# Patient Record
Sex: Male | Born: 1960
Health system: Southern US, Community
[De-identification: ages and names within clinical notes are randomized; demographics above are authoritative.]

## PROBLEM LIST (undated history)

## (undated) DIAGNOSIS — E119 Type 2 diabetes mellitus without complications: Secondary | ICD-10-CM

## (undated) DIAGNOSIS — F329 Major depressive disorder, single episode, unspecified: Secondary | ICD-10-CM

## (undated) DIAGNOSIS — B20 Human immunodeficiency virus [HIV] disease: Secondary | ICD-10-CM

## (undated) DIAGNOSIS — I639 Cerebral infarction, unspecified: Secondary | ICD-10-CM

## (undated) DIAGNOSIS — E785 Hyperlipidemia, unspecified: Secondary | ICD-10-CM

## (undated) DIAGNOSIS — H479 Unspecified disorder of visual pathways: Secondary | ICD-10-CM

## (undated) DIAGNOSIS — F32A Depression, unspecified: Secondary | ICD-10-CM

## (undated) DIAGNOSIS — I10 Essential (primary) hypertension: Secondary | ICD-10-CM

## (undated) DIAGNOSIS — M109 Gout, unspecified: Secondary | ICD-10-CM

## (undated) DIAGNOSIS — Z5189 Encounter for other specified aftercare: Secondary | ICD-10-CM

## (undated) HISTORY — DX: Hyperlipidemia, unspecified: E78.5

## (undated) HISTORY — DX: Major depressive disorder, single episode, unspecified: F32.9

## (undated) HISTORY — DX: Type 2 diabetes mellitus without complications: E11.9

## (undated) HISTORY — DX: Unspecified disorder of visual pathways: H47.9

## (undated) HISTORY — PX: APPENDECTOMY: SHX54

## (undated) HISTORY — DX: Cerebral infarction, unspecified: I63.9

## (undated) HISTORY — DX: Encounter for other specified aftercare: Z51.89

## (undated) HISTORY — PX: TONSILLECTOMY: SUR1361

## (undated) HISTORY — DX: Depression, unspecified: F32.A

---

## 2000-06-06 DIAGNOSIS — H479 Unspecified disorder of visual pathways: Secondary | ICD-10-CM

## 2000-06-06 DIAGNOSIS — I639 Cerebral infarction, unspecified: Secondary | ICD-10-CM

## 2000-06-06 HISTORY — DX: Unspecified disorder of visual pathways: H47.9

## 2000-06-06 HISTORY — DX: Cerebral infarction, unspecified: I63.9

## 2005-08-31 ENCOUNTER — Ambulatory Visit: Payer: Self-pay | Admitting: Family Medicine

## 2007-10-08 ENCOUNTER — Emergency Department (HOSPITAL_COMMUNITY): Admission: EM | Admit: 2007-10-08 | Discharge: 2007-10-08 | Payer: Self-pay | Admitting: Family Medicine

## 2007-12-17 ENCOUNTER — Emergency Department (HOSPITAL_COMMUNITY): Admission: EM | Admit: 2007-12-17 | Discharge: 2007-12-17 | Payer: Self-pay | Admitting: Family Medicine

## 2008-01-17 ENCOUNTER — Ambulatory Visit: Payer: Self-pay | Admitting: Internal Medicine

## 2008-01-17 LAB — CONVERTED CEMR LAB
ALT: 31 units/L (ref 0–53)
AST: 22 units/L (ref 0–37)
Albumin: 4.7 g/dL (ref 3.5–5.2)
Alkaline Phosphatase: 131 units/L — ABNORMAL HIGH (ref 39–117)
BUN: 17 mg/dL (ref 6–23)
Basophils Absolute: 0 10*3/uL (ref 0.0–0.1)
Basophils Relative: 0 % (ref 0–1)
CD4 T Helper %: 9 % — ABNORMAL LOW (ref 32–62)
Chlamydia, Swab/Urine, PCR: NEGATIVE
Chloride: 101 meq/L (ref 96–112)
Eosinophils Absolute: 0.1 10*3/uL (ref 0.0–0.7)
HCV Ab: NEGATIVE
HIV-1 RNA Quant, Log: 4.55 — ABNORMAL HIGH (ref <1.70)
MCHC: 32.6 g/dL (ref 30.0–36.0)
MCV: 87.8 fL (ref 78.0–100.0)
Monocytes Relative: 8 % (ref 3–12)
Neutro Abs: 2.3 10*3/uL (ref 1.7–7.7)
Neutrophils Relative %: 60 % (ref 43–77)
Platelets: 137 10*3/uL — ABNORMAL LOW (ref 150–400)
Potassium: 3.9 meq/L (ref 3.5–5.3)
RDW: 16.3 % — ABNORMAL HIGH (ref 11.5–15.5)
Sodium: 138 meq/L (ref 135–145)
Total Lymphocytes %: 31 % (ref 12–46)
WBC, lymph enumeration: 3.9 10*3/uL — ABNORMAL LOW (ref 4.0–10.5)

## 2008-02-18 ENCOUNTER — Ambulatory Visit: Payer: Self-pay | Admitting: Internal Medicine

## 2008-02-18 LAB — CONVERTED CEMR LAB
Absolute CD4: 191 #/uL — ABNORMAL LOW (ref 381–1469)
BUN: 15 mg/dL (ref 6–23)
Chloride: 108 meq/L (ref 96–112)
Creatinine, Ser: 1.57 mg/dL — ABNORMAL HIGH (ref 0.40–1.50)
Glucose, Bld: 93 mg/dL (ref 70–99)
HIV 1 RNA Quant: 2140 copies/mL — ABNORMAL HIGH (ref <50)
Potassium: 4.4 meq/L (ref 3.5–5.3)
Total Lymphocytes %: 29 % (ref 12–46)

## 2008-03-20 ENCOUNTER — Encounter: Payer: Self-pay | Admitting: Internal Medicine

## 2008-03-20 ENCOUNTER — Ambulatory Visit: Payer: Self-pay | Admitting: Internal Medicine

## 2008-04-14 DIAGNOSIS — E785 Hyperlipidemia, unspecified: Secondary | ICD-10-CM

## 2008-04-14 DIAGNOSIS — I10 Essential (primary) hypertension: Secondary | ICD-10-CM

## 2008-04-14 DIAGNOSIS — B20 Human immunodeficiency virus [HIV] disease: Secondary | ICD-10-CM

## 2008-04-14 DIAGNOSIS — M109 Gout, unspecified: Secondary | ICD-10-CM

## 2008-07-21 ENCOUNTER — Ambulatory Visit: Payer: Self-pay | Admitting: Internal Medicine

## 2008-07-21 ENCOUNTER — Encounter: Payer: Self-pay | Admitting: Internal Medicine

## 2008-07-21 DIAGNOSIS — K0889 Other specified disorders of teeth and supporting structures: Secondary | ICD-10-CM

## 2008-07-21 LAB — CONVERTED CEMR LAB
Alkaline Phosphatase: 127 units/L — ABNORMAL HIGH (ref 39–117)
BUN: 19 mg/dL (ref 6–23)
CO2: 23 meq/L (ref 19–32)
Eosinophils Absolute: 0 10*3/uL (ref 0.0–0.7)
Eosinophils Relative: 1 % (ref 0–5)
Glucose, Bld: 99 mg/dL (ref 70–99)
HCT: 45.3 % (ref 39.0–52.0)
HIV 1 RNA Quant: 17200 copies/mL — ABNORMAL HIGH (ref <48)
HIV-1 RNA Quant, Log: 4.24 — ABNORMAL HIGH (ref <1.68)
Hemoglobin: 14.9 g/dL (ref 13.0–17.0)
Lymphocytes Relative: 25 % (ref 12–46)
Lymphs Abs: 1.2 10*3/uL (ref 0.7–4.0)
MCV: 86 fL (ref 78.0–100.0)
Monocytes Absolute: 0.5 10*3/uL (ref 0.1–1.0)
Monocytes Relative: 11 % (ref 3–12)
RBC: 5.27 M/uL (ref 4.22–5.81)
Total Bilirubin: 0.3 mg/dL (ref 0.3–1.2)
Total Lymphocytes %: 25 % (ref 12–46)
Total lymphocyte count: 1200 cells/mcL (ref 700–3300)
WBC: 4.8 10*3/uL (ref 4.0–10.5)

## 2008-07-24 ENCOUNTER — Encounter: Payer: Self-pay | Admitting: Internal Medicine

## 2008-08-11 ENCOUNTER — Ambulatory Visit: Payer: Self-pay | Admitting: Internal Medicine

## 2008-08-11 ENCOUNTER — Encounter: Payer: Self-pay | Admitting: Internal Medicine

## 2008-08-12 ENCOUNTER — Telehealth: Payer: Self-pay | Admitting: Internal Medicine

## 2008-09-22 ENCOUNTER — Ambulatory Visit: Payer: Self-pay | Admitting: Internal Medicine

## 2008-09-22 LAB — CONVERTED CEMR LAB
Absolute CD4: 163 #/uL — ABNORMAL LOW (ref 381–1469)
BUN: 19 mg/dL (ref 6–23)
Basophils Relative: 0 % (ref 0–1)
CD4 Count: 163 microliters
CD4 T Helper %: 9 % — ABNORMAL LOW (ref 32–62)
CO2: 25 meq/L (ref 19–32)
Calcium: 9.8 mg/dL (ref 8.4–10.5)
Chloride: 103 meq/L (ref 96–112)
Cholesterol: 204 mg/dL — ABNORMAL HIGH (ref 0–200)
Creatinine, Ser: 1.54 mg/dL — ABNORMAL HIGH (ref 0.40–1.50)
Eosinophils Absolute: 0.1 10*3/uL (ref 0.0–0.7)
Eosinophils Relative: 1 % (ref 0–5)
Glucose, Bld: 96 mg/dL (ref 70–99)
HCT: 43.9 % (ref 39.0–52.0)
HDL: 32 mg/dL — ABNORMAL LOW (ref >39)
HIV-1 RNA Quant, Log: 2.52 — ABNORMAL HIGH (ref <1.68)
Lymphs Abs: 1.8 10*3/uL (ref 0.7–4.0)
MCHC: 31.9 g/dL (ref 30.0–36.0)
MCV: 88.5 fL (ref 78.0–100.0)
Monocytes Relative: 7 % (ref 3–12)
Neutrophils Relative %: 58 % (ref 43–77)
Platelets: 263 10*3/uL (ref 150–400)
RBC: 4.96 M/uL (ref 4.22–5.81)
Total Bilirubin: 1.7 mg/dL — ABNORMAL HIGH (ref 0.3–1.2)
Total CHOL/HDL Ratio: 6.4
Total lymphocyte count: 1815 cells/mcL (ref 700–3300)
Triglycerides: 149 mg/dL (ref <150)
VLDL: 30 mg/dL (ref 0–40)
WBC: 5.5 10*3/uL (ref 4.0–10.5)

## 2008-10-13 ENCOUNTER — Encounter: Payer: Self-pay | Admitting: Internal Medicine

## 2008-10-13 ENCOUNTER — Ambulatory Visit: Payer: Self-pay | Admitting: Internal Medicine

## 2008-10-22 ENCOUNTER — Encounter: Payer: Self-pay | Admitting: Internal Medicine

## 2009-01-15 ENCOUNTER — Ambulatory Visit: Payer: Self-pay | Admitting: Internal Medicine

## 2009-01-15 ENCOUNTER — Encounter: Payer: Self-pay | Admitting: Internal Medicine

## 2009-01-15 DIAGNOSIS — B351 Tinea unguium: Secondary | ICD-10-CM

## 2009-01-15 LAB — CONVERTED CEMR LAB
ALT: 24 units/L (ref 0–53)
AST: 23 units/L (ref 0–37)
Absolute CD4: 150 #/uL — ABNORMAL LOW (ref 381–1469)
CD4 T Helper %: 10 % — ABNORMAL LOW (ref 32–62)
Calcium: 8.9 mg/dL (ref 8.4–10.5)
Chloride: 108 meq/L (ref 96–112)
Creatinine, Ser: 1.57 mg/dL — ABNORMAL HIGH (ref 0.40–1.50)
Eosinophils Absolute: 0.1 10*3/uL (ref 0.0–0.7)
Lymphs Abs: 1.5 10*3/uL (ref 0.7–4.0)
MCV: 88.2 fL (ref 78.0–100.0)
Neutro Abs: 2.7 10*3/uL (ref 1.7–7.7)
Neutrophils Relative %: 57 % (ref 43–77)
Platelets: 139 10*3/uL — ABNORMAL LOW (ref 150–400)
RBC: 4.98 M/uL (ref 4.22–5.81)
Sodium: 140 meq/L (ref 135–145)
Total Bilirubin: 1.4 mg/dL — ABNORMAL HIGH (ref 0.3–1.2)
Total Protein: 7.3 g/dL (ref 6.0–8.3)
WBC: 4.7 10*3/uL (ref 4.0–10.5)

## 2009-02-12 ENCOUNTER — Encounter: Payer: Self-pay | Admitting: Internal Medicine

## 2009-02-12 ENCOUNTER — Ambulatory Visit: Payer: Self-pay | Admitting: Internal Medicine

## 2009-02-12 LAB — CONVERTED CEMR LAB
HIV 1 RNA Quant: 269 {copies}/mL — ABNORMAL HIGH
HIV-1 RNA Quant, Log: 2.43 — ABNORMAL HIGH

## 2009-02-17 ENCOUNTER — Ambulatory Visit: Payer: Self-pay | Admitting: *Deleted

## 2009-02-25 ENCOUNTER — Encounter: Payer: Self-pay | Admitting: Internal Medicine

## 2009-03-05 ENCOUNTER — Ambulatory Visit: Payer: Self-pay | Admitting: Internal Medicine

## 2009-03-05 ENCOUNTER — Encounter: Payer: Self-pay | Admitting: Internal Medicine

## 2009-05-25 ENCOUNTER — Ambulatory Visit: Payer: Self-pay | Admitting: Internal Medicine

## 2009-05-25 LAB — CONVERTED CEMR LAB
ALT: 50 units/L (ref 0–53)
AST: 25 units/L (ref 0–37)
Albumin: 4.7 g/dL (ref 3.5–5.2)
Calcium: 10.2 mg/dL (ref 8.4–10.5)
Chloride: 101 meq/L (ref 96–112)
Creatinine, Ser: 1.69 mg/dL — ABNORMAL HIGH (ref 0.40–1.50)
Lymphocytes Relative: 32 % (ref 12–46)
Lymphs Abs: 1.8 10*3/uL (ref 0.7–4.0)
Neutro Abs: 3.2 10*3/uL (ref 1.7–7.7)
Neutrophils Relative %: 56 % (ref 43–77)
Platelets: 267 10*3/uL (ref 150–400)
Potassium: 4.1 meq/L (ref 3.5–5.3)
Sodium: 136 meq/L (ref 135–145)
Total lymphocyte count: 1792 cells/mcL (ref 700–3300)
WBC: 5.6 10*3/uL (ref 4.0–10.5)

## 2009-05-26 ENCOUNTER — Emergency Department (HOSPITAL_COMMUNITY): Admission: EM | Admit: 2009-05-26 | Discharge: 2009-05-26 | Payer: Self-pay | Admitting: Family Medicine

## 2009-05-28 ENCOUNTER — Ambulatory Visit: Payer: Self-pay | Admitting: Internal Medicine

## 2009-05-28 ENCOUNTER — Encounter: Payer: Self-pay | Admitting: Internal Medicine

## 2009-05-28 DIAGNOSIS — M25569 Pain in unspecified knee: Secondary | ICD-10-CM

## 2009-12-10 ENCOUNTER — Emergency Department (HOSPITAL_COMMUNITY): Admission: EM | Admit: 2009-12-10 | Discharge: 2009-12-10 | Payer: Self-pay | Admitting: Family Medicine

## 2009-12-14 ENCOUNTER — Encounter (INDEPENDENT_AMBULATORY_CARE_PROVIDER_SITE_OTHER): Payer: Self-pay | Admitting: Licensed Clinical Social Worker

## 2010-01-26 ENCOUNTER — Encounter: Payer: Self-pay | Admitting: Internal Medicine

## 2010-02-17 ENCOUNTER — Ambulatory Visit: Payer: Self-pay | Admitting: Internal Medicine

## 2010-02-17 ENCOUNTER — Telehealth: Payer: Self-pay | Admitting: Internal Medicine

## 2010-02-17 LAB — CONVERTED CEMR LAB
Albumin: 4.7 g/dL (ref 3.5–5.2)
Alkaline Phosphatase: 125 units/L — ABNORMAL HIGH (ref 39–117)
BUN: 17 mg/dL (ref 6–23)
Calcium: 10 mg/dL (ref 8.4–10.5)
Chloride: 100 meq/L (ref 96–112)
Eosinophils Absolute: 0.4 10*3/uL (ref 0.0–0.7)
Glucose, Bld: 96 mg/dL (ref 70–99)
HIV 1 RNA Quant: 366 copies/mL — ABNORMAL HIGH (ref <20)
HIV-1 RNA Quant, Log: 2.56 — ABNORMAL HIGH (ref <1.30)
Lymphs Abs: 2.2 10*3/uL (ref 0.7–4.0)
MCV: 91.8 fL (ref 78.0–100.0)
Monocytes Relative: 11 % (ref 3–12)
Neutrophils Relative %: 45 % (ref 43–77)
Potassium: 4.7 meq/L (ref 3.5–5.3)
RBC: 5.02 M/uL (ref 4.22–5.81)
WBC: 5.9 10*3/uL (ref 4.0–10.5)

## 2010-03-03 ENCOUNTER — Ambulatory Visit: Payer: Self-pay | Admitting: Internal Medicine

## 2010-03-08 ENCOUNTER — Encounter: Payer: Self-pay | Admitting: Internal Medicine

## 2010-07-06 NOTE — Progress Notes (Signed)
Summary: c/o gout, called in Vicodin  Phone Note Call from Patient   Caller: Patient Summary of Call: Pt. came in for labs and was requesting something for gout.  I asked him what he had taken in the past and he showed me RX bottles for prednisone and hydrocodone.  He would like something called into [facility] on [address] Initial call taken by: Wendall Mola CMA Duncan Dull),  February 17, 2010 9:51 AM  Follow-up for Phone Call        vicodin 5/500 q 8 hours as needed #60 Follow-up by: Yisroel Ramming MD,  February 17, 2010 11:23 AM    New/Updated Medications: VICODIN 5-500 MG TABS (HYDROCODONE-ACETAMINOPHEN) take one tablet every 6 hours as needed for pain Prescriptions: VICODIN 5-500 MG TABS (HYDROCODONE-ACETAMINOPHEN) take one tablet every 6 hours as needed for pain  #60 x 0   Entered by:   Wendall Mola CMA ( AAMA)   Authorized by:   Yisroel Ramming MD   Signed by:   Wendall Mola CMA ( AAMA) on 02/17/2010   Method used:   Telephoned to ...       [facility] [address] [phone]* (retail)       [address]       [address]       [place], [place]  [address]       Ph: [phone]       Fax: [phone]   RxID:   [id number]  Rx called and pt. aware Wendall Mola CMA Duncan Dull)  February 17, 2010 12:40 PM

## 2010-07-06 NOTE — Assessment & Plan Note (Signed)
Summary: followup on labs/jc   CC:  follow-up visit, lab results, B/P very elevated, pt. states he has not taken B/P meds for two days, c/o trouble sleeping, and diarrhea after taking HIV meds.  History of Present Illness: Pt recently ran out of his BP medication. He will pick it up today. Pt c/o diarrhea after taking his meds.  He takes them all at once.  He denies abdominal pain. He has not tried anything over the counter for his diarrhea.  Preventive Screening-Counseling & Management  Alcohol-Tobacco     Alcohol drinks/day: 0     Smoking Status: current     Packs/Day: <0.25     Year Started: 2 years     Cans of tobacco/week: no  Caffeine-Diet-Exercise     Caffeine use/day: 0     Does Patient Exercise: yes     Type of exercise: walks     Exercise (avg: min/session): >60     Times/week: 7  Hep-HIV-STD-Contraception     HIV Risk: no     Sun Exposure-Excessive: rarely  Safety-Violence-Falls     Seat Belt Use: 100      Drug Use:  never and no.    Comments: pt. given condoms   Updated Prior Medication List: VIREAD 300 MG TABS (TENOFOVIR DISOPROXIL FUMARATE) Take 1 tablet by mouth once a day REYATAZ 300 MG CAPS (ATAZANAVIR SULFATE) Take 1 tablet by mouth once a day NORVIR 100 MG TABS (RITONAVIR) take 1 tablet by mouth once daily DIDANOSINE 250 MG CPDR (DIDANOSINE) Take 1 tablet by mouth once a day VASOTEC 10 MG TABS (ENALAPRIL MALEATE) Take 1 tablet by mouth once a day HYDROCHLOROTHIAZIDE 25 MG  TABS (HYDROCHLOROTHIAZIDE) one by mouth qd BACTRIM DS 800-160 MG TABS (SULFAMETHOXAZOLE-TRIMETHOPRIM) Take 1 tablet by mouth once a day ATENOLOL 100 MG TABS (ATENOLOL) Take 1 tablet by mouth once a day VICODIN 5-500 MG TABS (HYDROCODONE-ACETAMINOPHEN) take one tablet every 6 hours as needed for pain MARINOL 5 MG CAPS (DRONABINOL) Take 1 tablet by mouth three times a day before meals  Current Allergies (reviewed today): No known allergies  Past History:  Past Medical  History: Last updated: 04/14/2008 Gout Hyperlipidemia Hypertension HIV disease  Social History: Drug Use:  never, no  Review of Systems  The patient denies anorexia, fever, weight loss, abdominal pain, melena, and hematochezia.    Vital Signs:  Patient profile:   50 year old male Height:      70.5 inches (179.07 cm) Weight:      202.8 pounds (92.18 kg) BMI:     28.79 Temp:     98.0 degrees F (36.67 degrees C) oral Pulse rate:   94 / minute BP sitting:   174 / 106  (right arm)  Vitals Entered By: Wendall Mola CMA Duncan Dull) (March 03, 2010 9:35 AM) CC: follow-up visit, lab results, B/P very elevated, pt. states he has not taken B/P meds for two days, c/o trouble sleeping, diarrhea after taking HIV meds Is Patient Diabetic? No Pain Assessment Patient in pain? no      Nutritional Status BMI of 25 - 29 = overweight Nutritional Status Detail appetite "so-so"  Does patient need assistance? Functional Status Self care Ambulation Normal Comments no missed doses of HIV meds per pt.   Physical Exam  General:  alert, well-developed, well-nourished, and well-hydrated.   Head:  normocephalic and atraumatic.   Mouth:  pharynx pink and moist.   Lungs:  normal breath sounds.      Impression &  Recommendations:  Problem # 1:  HIV DISEASE (ICD-042) Pt.s most recent CD4ct was 290 and VL 366 .  Pt instructed to continue the current antiretroviral regimen.  Pt encouraged to take medication regularly and not miss doses. Pt instructed to try not taking all ofhis meds at the same time to help his diarrhea. Pt will f/u in 3 months for repeat blood work and will see me 2 weeks later.  Diagnostics Reviewed:  HIV: CDC-defined AIDS (05/28/2009)   CD4: 290 (02/18/2010)   WBC: 5.9 (02/17/2010)   Hgb: 14.4 (02/17/2010)   HCT: 46.1 (02/17/2010)   Platelets: 202 (02/17/2010) HIV genotype: See Comment (07/21/2008)   HIV-1 RNA: 366 (02/17/2010)   HBSAg: NEG (01/17/2008)  Problem # 2:   HYPERTENSION (ICD-401.9) pt encouraged to pick up his meds ASAP. His updated medication list for this problem includes:    Vasotec 10 Mg Tabs (Enalapril maleate) .Marland Kitchen... Take 1 tablet by mouth once a day    Hydrochlorothiazide 25 Mg Tabs (Hydrochlorothiazide) ..... One by mouth qd    Atenolol 100 Mg Tabs (Atenolol) .Marland Kitchen... Take 1 tablet by mouth once a day  Medications Added to Medication List This Visit: 1)  Marinol 5 Mg Caps (Dronabinol) .... Take 1 tablet by mouth three times a day before meals  Other Orders: Est. Patient Level III ([address]) TB Skin Test [phone]) Admin 1st Vaccine (098[address]) Admin 1st Vaccine [facility]) [phone]) Future Orders: T-CD4SP ([facility]) (CD4SP) ... 06/01/2010 T-HIV Viral Load [phone]) ... 06/01/2010 T-Comprehensive Metabolic Panel [phone]) ... 06/01/2010 T-CBC w/Diff ([id number]) ... 06/01/2010 T-Lipid Profile [phone]) ... 06/01/2010  Patient Instructions: 1)  Please schedule a follow-up appointment in 3 months, 2 weeks after labs.  Prescriptions: MARINOL 5 MG CAPS (DRONABINOL) Take 1 tablet by mouth three times a day before meals  #90 x 0   Entered and Authorized by:   Yisroel Ramming MD   Signed by:   Yisroel Ramming MD on 03/03/2010   Method used:   Print then Give to Patient   RxID:   [id number] ATENOLOL 100 MG TABS (ATENOLOL) Take 1 tablet by mouth once a day  #30 x 5   Entered and Authorized by:   Yisroel Ramming MD   Signed by:   Yisroel Ramming MD on 03/03/2010   Method used:   Print then Give to Patient   RxID:   [id number] BACTRIM DS 800-160 MG TABS (SULFAMETHOXAZOLE-TRIMETHOPRIM) Take 1 tablet by mouth once a day  #30 x 5   Entered and Authorized by:   Yisroel Ramming MD   Signed by:   Yisroel Ramming MD on 03/03/2010   Method used:   Print then Give to Patient   RxID:   [id number] HYDROCHLOROTHIAZIDE 25 MG  TABS (HYDROCHLOROTHIAZIDE) one by mouth qd  #30 x 5   Entered and Authorized by:   Yisroel Ramming MD   Signed by:   Yisroel Ramming MD on 03/03/2010   Method used:   Print then Give to Patient   RxID:   [id number] VASOTEC 10 MG TABS (ENALAPRIL MALEATE) Take 1 tablet by mouth once a day  #30 x 5   Entered and Authorized by:   Yisroel Ramming MD   Signed by:   Yisroel Ramming MD on 03/03/2010   Method used:   Print then Give to Patient   RxID:   [id number] DIDANOSINE 250 MG CPDR (DIDANOSINE) Take 1 tablet by mouth once a day  #30 x 5   Entered and Authorized by:   Tresa Endo  Vollmer MD   Signed by:   Yisroel Ramming MD on 03/03/2010   Method used:   Print then Give to Patient   RxID:   [id number] NORVIR 100 MG TABS (RITONAVIR) take 1 tablet by mouth once daily  #30 x 5   Entered and Authorized by:   Yisroel Ramming MD   Signed by:   Yisroel Ramming MD on 03/03/2010   Method used:   Print then Give to Patient   RxID:   [id number] REYATAZ 300 MG CAPS (ATAZANAVIR SULFATE) Take 1 tablet by mouth once a day  #30 x 5   Entered and Authorized by:   Yisroel Ramming MD   Signed by:   Yisroel Ramming MD on 03/03/2010   Method used:   Print then Give to Patient   RxID:   [id number] VIREAD 300 MG TABS (TENOFOVIR DISOPROXIL FUMARATE) Take 1 tablet by mouth once a day  #30 x 5   Entered and Authorized by:   Yisroel Ramming MD   Signed by:   Yisroel Ramming MD on 03/03/2010   Method used:   Print then Give to Patient   RxID:   [id number]    PPD Application    Vaccine Type: PPD    Site: right forearm    Mfr: Sanofi Pasteur    Dose: 0.1 ml    Route: ID    Given by: Wendall Mola CMA ( AAMA)    Exp. Date: 04/08/2011    Lot #: [id number]     Appended Document: TB skin test negative    Clinical Lists Changes  Observations: Added new observation of TB PPDRESULT: negative (03/05/2010 13:51) Added new observation of PPD RESULT: < 5mm (03/05/2010 13:51) Added new observation of TB-PPD RDDTE: 03/05/2010 (03/05/2010 13:51)       PPD Results    Date of reading: 03/05/2010    Results: <  5mm    Interpretation: negative Per pt report.   Tomasita Morrow RN  March 05, 2010 1:53 PM

## 2010-07-06 NOTE — Miscellaneous (Signed)
Summary: HIPAA Restrictions  HIPAA Restrictions   Imported By: Florinda Marker 02/18/2010 09:09:51  _____________________________________________________________________  External Attachment:    Type:   Image     Comment:   External Document

## 2010-07-06 NOTE — Miscellaneous (Signed)
Summary: RW Update  Clinical Lists Changes  Observations: Added new observation of DATE1STVISIT: 03/03/2010 (03/08/2010 16:07) Added new observation of RWPARTICIP: Yes (03/08/2010 16:07)

## 2010-07-06 NOTE — Miscellaneous (Signed)
Summary: Orders Update  Clinical Lists Changes  Orders: Added new Test order of T-CBC w/Diff [phone]) - Signed Added new Test order of T-CD4SP [facility]) (CD4SP) - Signed Added new Test order of T-Comprehensive Metabolic Panel [phone]) - Signed Added new Test order of T-HIV Viral Load [phone]) - Signed     Process Orders Check Orders Results:     [facility]: Check successful Tests Sent for requisitioning (January 27, 2010 2:18 PM):     02/17/2010: [facility] -- T-CBC w/Diff [[id number]] (signed)     02/17/2010: [facility] -- T-Comprehensive Metabolic Panel [[id number]] (signed)     02/17/2010: [facility] -- T-HIV Viral Load [id number] (signed)

## 2010-07-06 NOTE — Miscellaneous (Signed)
Summary: Orders Update  Clinical Lists Changes  Medications: Changed medication from NORVIR 100 MG CAPS (RITONAVIR) Take 1 tablet by mouth once a day to NORVIR 100 MG TABS (RITONAVIR) take 1 tablet by mouth once daily - Signed Rx of NORVIR 100 MG TABS (RITONAVIR) take 1 tablet by mouth once daily;  #30 x 0;  Signed;  Entered by: Starleen Arms CMA;  Authorized by: Starleen Arms CMA;  Method used: Print then Give to Patient    Prescriptions: NORVIR 100 MG TABS (RITONAVIR) take 1 tablet by mouth once daily  #30 x 0   Entered and Authorized by:   Starleen Arms CMA   Signed by:   Starleen Arms CMA on 12/14/2009   Method used:   Print then Give to Patient   RxID:   [id number]

## 2010-07-09 ENCOUNTER — Encounter (INDEPENDENT_AMBULATORY_CARE_PROVIDER_SITE_OTHER): Payer: Self-pay | Admitting: *Deleted

## 2010-07-14 NOTE — Miscellaneous (Signed)
Summary: RW update  Clinical Lists Changes  Observations: Added new observation of LATINO/HISP: No (07/09/2010 16:33)

## 2010-08-19 LAB — T-HELPER CELL (CD4) - (RCID CLINIC ONLY): CD4 T Cell Abs: 290 uL — ABNORMAL LOW (ref 400–2700)

## 2010-09-01 ENCOUNTER — Other Ambulatory Visit: Payer: Self-pay | Admitting: *Deleted

## 2010-09-14 ENCOUNTER — Other Ambulatory Visit (INDEPENDENT_AMBULATORY_CARE_PROVIDER_SITE_OTHER): Payer: Medicare Other

## 2010-09-14 DIAGNOSIS — B20 Human immunodeficiency virus [HIV] disease: Secondary | ICD-10-CM

## 2010-09-14 DIAGNOSIS — Z79899 Other long term (current) drug therapy: Secondary | ICD-10-CM

## 2010-09-15 LAB — CBC WITH DIFFERENTIAL/PLATELET
Basophils Relative: 0 % (ref 0–1)
Hemoglobin: 14.6 g/dL (ref 13.0–17.0)
Lymphs Abs: 2 10*3/uL (ref 0.7–4.0)
MCHC: 33 g/dL (ref 30.0–36.0)
Monocytes Relative: 6 % (ref 3–12)
Neutro Abs: 4.2 10*3/uL (ref 1.7–7.7)
Neutrophils Relative %: 62 % (ref 43–77)
Platelets: 202 10*3/uL (ref 150–400)
RBC: 4.97 MIL/uL (ref 4.22–5.81)

## 2010-09-15 LAB — LIPID PANEL
Cholesterol: 213 mg/dL — ABNORMAL HIGH (ref 0–200)
Triglycerides: 114 mg/dL (ref <150)
VLDL: 23 mg/dL (ref 0–40)

## 2010-09-15 LAB — COMPLETE METABOLIC PANEL WITH GFR
BUN: 20 mg/dL (ref 6–23)
CO2: 25 mEq/L (ref 19–32)
Creat: 1.5 mg/dL (ref 0.40–1.50)
GFR, Est African American: 60 mL/min — ABNORMAL LOW (ref >60)
GFR, Est Non African American: 50 mL/min — ABNORMAL LOW (ref >60)
Glucose, Bld: 100 mg/dL — ABNORMAL HIGH (ref 70–99)
Total Bilirubin: 1 mg/dL (ref 0.3–1.2)

## 2010-09-21 ENCOUNTER — Inpatient Hospital Stay (INDEPENDENT_AMBULATORY_CARE_PROVIDER_SITE_OTHER)
Admission: RE | Admit: 2010-09-21 | Discharge: 2010-09-21 | Disposition: A | Discharge status: Home / Self Care | Payer: Medicare Other | Source: Ambulatory Visit

## 2010-09-21 DIAGNOSIS — M109 Gout, unspecified: Secondary | ICD-10-CM

## 2010-09-28 ENCOUNTER — Ambulatory Visit: Payer: Self-pay | Admitting: Adult Health

## 2010-09-28 ENCOUNTER — Ambulatory Visit (INDEPENDENT_AMBULATORY_CARE_PROVIDER_SITE_OTHER): Payer: Medicare Other | Admitting: Adult Health

## 2010-09-28 ENCOUNTER — Encounter: Payer: Self-pay | Admitting: Adult Health

## 2010-09-28 DIAGNOSIS — M109 Gout, unspecified: Secondary | ICD-10-CM

## 2010-09-28 DIAGNOSIS — I10 Essential (primary) hypertension: Secondary | ICD-10-CM

## 2010-09-28 DIAGNOSIS — E785 Hyperlipidemia, unspecified: Secondary | ICD-10-CM

## 2010-09-28 DIAGNOSIS — B20 Human immunodeficiency virus [HIV] disease: Secondary | ICD-10-CM

## 2010-09-28 MED ORDER — EMTRICITABINE-TENOFOVIR DF 200-300 MG PO TABS: 1.0000 | ORAL_TABLET | Freq: Every day | ORAL | Status: DC

## 2010-09-28 MED ORDER — RALTEGRAVIR POTASSIUM 400 MG PO TABS: 400.0000 mg | ORAL_TABLET | Freq: Two times a day (BID) | ORAL | Status: DC

## 2010-09-28 NOTE — Progress Notes (Signed)
Subjective:    Patient ID: Chad Moses, male    DOB: [birth year], 50 y.o.   MRN: [id number]  HPI presents to clinic for followup reports a recent visit to the emergency room for flareup of his gout. This time involving the digits of both his hands, both knees, ankles, and digits of both feet. He stated that the pain was significant enough that he will could barely walk. He was given a prescription for prednisone, which he has completed. He states currently, the pain is much less however, he still has joint swelling. He also reports he has had "nodules or bumps." In the joint areas of his ankles and knees. He remains adherent to his antiretroviral medications and endorses good tolerance to these meds.  Review of Systems  Constitutional: Positive for activity change. Negative for fever, chills, diaphoresis, appetite change, fatigue and unexpected weight change.  HENT: Negative.   Eyes: Negative.   Respiratory: Negative.   Cardiovascular: Negative.   Gastrointestinal: Negative.   Genitourinary: Negative.   Musculoskeletal: Positive for myalgias, joint swelling, arthralgias and gait problem.  Skin: Negative for pallor and rash.  Neurological: Negative.   Hematological: Negative.   Psychiatric/Behavioral: Negative for suicidal ideas, hallucinations, behavioral problems, confusion, sleep disturbance, self-injury, dysphoric mood, decreased concentration and agitation. The patient is not nervous/anxious and is not hyperactive.        Objective:   Physical Exam  Constitutional: He is oriented to person, place, and time. He appears well-developed and well-nourished. No distress.  HENT:  Head: Normocephalic and atraumatic.  Right Ear: External ear normal.  Left Ear: External ear normal.  Nose: Nose normal.  Mouth/Throat: No oropharyngeal exudate.       Demonstrates halitosis, poor dentition, and dental plaque.  Eyes: Conjunctivae and EOM are normal. Pupils are equal, round, and reactive to  light.  Neck: Normal range of motion. Neck supple.  Cardiovascular: Normal rate, regular rhythm, normal heart sounds and intact distal pulses.   Pulmonary/Chest: Effort normal and breath sounds normal.  Abdominal: Soft. Bowel sounds are normal.  Musculoskeletal:       Right knee: He exhibits decreased range of motion and swelling. He exhibits no effusion. tenderness found.       Left knee: He exhibits decreased range of motion and swelling. tenderness found.       No tophi noted in the digits shows either hand nor on his knees or ankles. However swelling was noted at the knee joints and the ankles with distinct point tenderness. No bony deformity noted.  Neurological: He is alert and oriented to person, place, and time. No cranial nerve deficit. He exhibits normal muscle tone. Coordination normal.  Skin: Skin is warm and dry.  Psychiatric: He has a normal mood and affect. His behavior is normal. Judgment and thought content normal.          Assessment & Plan:  1 HIV. From labs obtained 09/14/2010, his CD4 count was 330 cells per cubic millimeter at 18%. His HIV viral load was 57 copies per mL. While he appears somewhat stable on his current regimen (Reyataz, Norvir, Videx, and Viread), some of his clinical findings, including elevations in his cholesterol, as well as some of the symptoms of gout he has been experiencing, may very well be attributed to this somewhat unique regimen. In order to better simplify his regimen as well as decreasing the potential for toxicities, we will discontinue these current medications and begin Truvada 1 tablet by mouth daily, and Isentress 400 mg  by mouth twice a day. Review of his past medical records do not show any documentation of prior resistance to any of the medications in this new regimen. However, given the unique nature or of the previous regimen, we will be watching closely his virologic response. He is instructed to return to clinic in 4 weeks for  repeat labs that will also include fasting lipids and to followup with [place] in 6 weeks. Drug regimen, drug effects, side effects, adverse drug reactions, and potential toxicities were discussed in detail with him. Additionally, we dispensed a twice a day chambered pill box. He verbally acknowledged all information that was provided to him and agreed with the plan of care.  2. Gout. Given the extent of his most recent symptoms and the engorged. Frequency of his attacks. We feel it prudent at this time to make a referral for him to a rheumatologist to further evaluate appropriate therapy. Strategies to decrease the frequency of attacks, as well as the potential for developing renal involvement in view of his multiple risk factors for renal disease, including hypertension, and hyperlipidemia.  3. Hyperlipidemia. We reviewed diet strategies with him as well as making changes to his antiretrovirals in the hopes this will additionally help improve outcomes. We will recheck his lipids on his next blood draw and evaluate at that time whether further intervention will be necessary.  4. Hypertension. Given a slight shift in his GFR on his most recent labs, we will be watching both his blood pressure and he is renal function more closely. Regardless of whether he received control of his blood pressure it may be necessary for [place] to refer him to a nephrologist for further evaluation. A urinalysis, as well as a uric acid have also been ordered with his next blood draw.  He verbally acknowledged all information that was provided to him, asked appropriate questions, for which answers were provided, and he agreed with plan of care.

## 2010-10-04 ENCOUNTER — Other Ambulatory Visit (INDEPENDENT_AMBULATORY_CARE_PROVIDER_SITE_OTHER): Payer: Medicare Other | Admitting: Licensed Clinical Social Worker

## 2010-10-04 ENCOUNTER — Other Ambulatory Visit: Payer: Self-pay | Admitting: *Deleted

## 2010-10-04 DIAGNOSIS — I10 Essential (primary) hypertension: Secondary | ICD-10-CM

## 2010-10-04 DIAGNOSIS — B20 Human immunodeficiency virus [HIV] disease: Secondary | ICD-10-CM

## 2010-10-04 MED ORDER — EMTRICITABINE-TENOFOVIR DF 200-300 MG PO TABS: 1.0000 | ORAL_TABLET | Freq: Every day | ORAL | Status: DC

## 2010-10-04 MED ORDER — RALTEGRAVIR POTASSIUM 400 MG PO TABS: 400.0000 mg | ORAL_TABLET | Freq: Two times a day (BID) | ORAL | Status: DC

## 2010-10-04 MED ORDER — HYDROCHLOROTHIAZIDE 25 MG PO TABS: 25.0000 mg | ORAL_TABLET | Freq: Every day | ORAL | Status: DC

## 2010-10-04 MED ORDER — ENALAPRIL MALEATE 10 MG PO TABS: 10.0000 mg | ORAL_TABLET | Freq: Every day | ORAL | Status: DC

## 2010-10-04 MED ORDER — ATENOLOL 100 MG PO TABS: 100.0000 mg | ORAL_TABLET | Freq: Every day | ORAL | Status: DC

## 2010-10-28 ENCOUNTER — Other Ambulatory Visit (INDEPENDENT_AMBULATORY_CARE_PROVIDER_SITE_OTHER): Payer: Medicare Other | Admitting: *Deleted

## 2010-10-28 ENCOUNTER — Other Ambulatory Visit: Payer: Self-pay | Admitting: Adult Health

## 2010-10-28 ENCOUNTER — Other Ambulatory Visit (INDEPENDENT_AMBULATORY_CARE_PROVIDER_SITE_OTHER): Payer: Medicare Other | Admitting: Adult Health

## 2010-10-28 ENCOUNTER — Other Ambulatory Visit: Payer: Medicare Other

## 2010-10-28 DIAGNOSIS — E785 Hyperlipidemia, unspecified: Secondary | ICD-10-CM

## 2010-10-28 DIAGNOSIS — B20 Human immunodeficiency virus [HIV] disease: Secondary | ICD-10-CM

## 2010-10-28 DIAGNOSIS — Z79899 Other long term (current) drug therapy: Secondary | ICD-10-CM

## 2010-10-28 DIAGNOSIS — M109 Gout, unspecified: Secondary | ICD-10-CM

## 2010-10-28 MED ORDER — EMTRICITABINE-TENOFOVIR DF 200-300 MG PO TABS: 1.0000 | ORAL_TABLET | Freq: Every day | ORAL | Status: DC

## 2010-10-28 MED ORDER — COLCHICINE 0.6 MG PO TABS: 0.6000 mg | ORAL_TABLET | Freq: Every day | ORAL | Status: DC

## 2010-10-28 MED ORDER — RALTEGRAVIR POTASSIUM 400 MG PO TABS: 400.0000 mg | ORAL_TABLET | Freq: Two times a day (BID) | ORAL | Status: DC

## 2010-10-29 LAB — CBC WITH DIFFERENTIAL/PLATELET
Basophils Absolute: 0 10*3/uL (ref 0.0–0.1)
Eosinophils Relative: 3 % (ref 0–5)
HCT: 47.2 % (ref 39.0–52.0)
Hemoglobin: 15.2 g/dL (ref 13.0–17.0)
Lymphocytes Relative: 29 % (ref 12–46)
Lymphs Abs: 1.7 10*3/uL (ref 0.7–4.0)
MCV: 88.6 fL (ref 78.0–100.0)
Monocytes Absolute: 0.4 10*3/uL (ref 0.1–1.0)
Monocytes Relative: 7 % (ref 3–12)
Neutro Abs: 3.5 10*3/uL (ref 1.7–7.7)
RDW: 16.4 % — ABNORMAL HIGH (ref 11.5–15.5)
WBC: 5.8 10*3/uL (ref 4.0–10.5)

## 2010-10-29 LAB — URINALYSIS
Glucose, UA: NEGATIVE mg/dL
Leukocytes, UA: NEGATIVE
Nitrite: NEGATIVE
Specific Gravity, Urine: 1.018 (ref 1.005–1.030)
pH: 5 (ref 5.0–8.0)

## 2010-10-29 LAB — COMPREHENSIVE METABOLIC PANEL
Albumin: 4.7 g/dL (ref 3.5–5.2)
BUN: 16 mg/dL (ref 6–23)
CO2: 24 mEq/L (ref 19–32)
Calcium: 10.4 mg/dL (ref 8.4–10.5)
Chloride: 103 mEq/L (ref 96–112)
Creat: 1.59 mg/dL — ABNORMAL HIGH (ref 0.40–1.50)
Glucose, Bld: 114 mg/dL — ABNORMAL HIGH (ref 70–99)
Potassium: 4.4 mEq/L (ref 3.5–5.3)

## 2010-10-29 LAB — LIPID PANEL: Cholesterol: 229 mg/dL — ABNORMAL HIGH (ref 0–200)

## 2010-10-29 LAB — HIV-1 RNA QUANT-NO REFLEX-BLD
HIV 1 RNA Quant: <20 copies/mL (ref <20)
HIV-1 RNA Quant, Log: <1.3 {Log} (ref <1.30)

## 2010-10-29 LAB — T-HELPER CELL (CD4) - (RCID CLINIC ONLY): CD4 T Cell Abs: 230 uL — ABNORMAL LOW (ref 400–2700)

## 2010-11-02 ENCOUNTER — Other Ambulatory Visit: Payer: Self-pay | Admitting: Licensed Clinical Social Worker

## 2010-11-11 ENCOUNTER — Ambulatory Visit (INDEPENDENT_AMBULATORY_CARE_PROVIDER_SITE_OTHER): Payer: Medicare Other | Admitting: Adult Health

## 2010-11-11 ENCOUNTER — Encounter: Payer: Self-pay | Admitting: Adult Health

## 2010-11-11 DIAGNOSIS — B20 Human immunodeficiency virus [HIV] disease: Secondary | ICD-10-CM

## 2010-11-11 DIAGNOSIS — M109 Gout, unspecified: Secondary | ICD-10-CM

## 2010-11-11 DIAGNOSIS — E785 Hyperlipidemia, unspecified: Secondary | ICD-10-CM

## 2010-11-11 MED ORDER — PRAVASTATIN SODIUM 20 MG PO TABS: 20.0000 mg | ORAL_TABLET | Freq: Every day | ORAL | Status: DC

## 2010-11-11 NOTE — Progress Notes (Signed)
  Subjective:    Patient ID: Chad Moses, male    DOB: 06-12-1960, 50 y.o.   MRN: [id number]  HPI Chad Moses presents to clinic for routine scheduled followup after induction with a new antiretroviral regimen, which include Isentress and Truvada. He states he is tolerating his regimen well without complication and has remained. Adherent without any missed doses. He states that his gout flareups have decreased also since he has been on his new regimen. However, he is scheduled to see the rheumatologist on 12/02/2010. He also endorses some visual acuity problems with his left eye which has been chronic and long-term in nature.   Review of Systems  Constitutional: Negative.   HENT: Negative.   Eyes: Positive for visual disturbance.  Respiratory: Negative.   Cardiovascular: Negative.   Gastrointestinal: Negative.   Genitourinary: Negative.   Musculoskeletal: Positive for joint swelling and arthralgias.  Neurological: Negative.   Hematological: Negative.   Psychiatric/Behavioral: Negative.        Objective:   Physical Exam  Constitutional: He is oriented to person, place, and time. He appears well-developed and well-nourished. No distress.  HENT:  Head: Normocephalic and atraumatic.  Right Ear: External ear normal.  Left Ear: External ear normal.  Nose: Nose normal.  Mouth/Throat: Oropharynx is clear and moist.  Eyes: Conjunctivae and EOM are normal. Pupils are equal, round, and reactive to light.  Neck: Normal range of motion. Neck supple. No thyromegaly present.  Cardiovascular: Normal rate and regular rhythm.   Pulmonary/Chest: Effort normal and breath sounds normal.  Abdominal: Soft. Bowel sounds are normal.  Musculoskeletal:       Some joint swelling, especially around the knees, is still noted.  Lymphadenopathy:    He has no cervical adenopathy.  Neurological: He is alert and oriented to person, place, and time. No cranial nerve deficit. He exhibits normal muscle tone.  Coordination normal.  Skin: Skin is warm and dry.  Psychiatric: He has a normal mood and affect. His behavior is normal. Judgment and thought content normal.          Assessment & Plan:  1. HIV. Labs obtained 10/26/2010 show a CD4 count of 230 at 13% with a viral load of less than 20 copies/mL. While there is improvement in viral load CD4 count in CD4 percent, and decreased, but this may be nothing more than diurinal variant and and we will continue to monitor. We will repeat labs in 6 weeks and have him return to clinic in 2 months.  2. Hyperlipidemia. Total cholesterol was 229, triglycerides were 166, HDL was 30, and LDL was 166. He still remains significantly elevated, and actually appeared to be low somewhat worse than the previous lab diabetes. At this point in time. We recommend starting pravastatin 20 mg by mouth daily. We will recheck his lipid panel on the next blood draw.  3. Renal Insufficiency. From repeat labs, his GFR by C-G was 71.5, and by MDRD was 65.6. While this remains below 90 mL/min, he still demonstrates some adequate renal function. We will continue to monitor this and any worsening and we will refer him to nephrology.  4. Visual Acuity Changes in the Left Eye. This may be nothing more than senescence and recommend that he contact a optometrist or an ophthalmologist closer to his home, who would accept his insurance.  He verbally acknowledged all this information and agreed with plan of care.

## 2011-01-03 ENCOUNTER — Other Ambulatory Visit: Payer: Medicare Other

## 2011-01-17 ENCOUNTER — Encounter: Payer: Self-pay | Admitting: Adult Health

## 2011-01-17 ENCOUNTER — Ambulatory Visit (INDEPENDENT_AMBULATORY_CARE_PROVIDER_SITE_OTHER): Payer: Medicare Other | Admitting: Adult Health

## 2011-01-17 DIAGNOSIS — E785 Hyperlipidemia, unspecified: Secondary | ICD-10-CM

## 2011-01-17 DIAGNOSIS — B20 Human immunodeficiency virus [HIV] disease: Secondary | ICD-10-CM

## 2011-01-17 DIAGNOSIS — Z79899 Other long term (current) drug therapy: Secondary | ICD-10-CM

## 2011-01-17 LAB — CBC WITH DIFFERENTIAL/PLATELET
Basophils Absolute: 0 10*3/uL (ref 0.0–0.1)
HCT: 45.3 % (ref 39.0–52.0)
Lymphocytes Relative: 29 % (ref 12–46)
Monocytes Absolute: 0.4 10*3/uL (ref 0.1–1.0)
Neutro Abs: 3.2 10*3/uL (ref 1.7–7.7)
Platelets: 198 10*3/uL (ref 150–400)
RDW: 17.4 % — ABNORMAL HIGH (ref 11.5–15.5)
WBC: 5.3 10*3/uL (ref 4.0–10.5)

## 2011-01-17 LAB — MICROALBUMIN / CREATININE URINE RATIO: Microalb, Ur: 2.17 mg/dL — ABNORMAL HIGH (ref 0.00–1.89)

## 2011-01-17 LAB — PHOSPHORUS: Phosphorus: 2.7 mg/dL (ref 2.3–4.6)

## 2011-01-18 LAB — LIPID PANEL
HDL: 39 mg/dL — ABNORMAL LOW (ref >39)
LDL Cholesterol: 111 mg/dL — ABNORMAL HIGH (ref 0–99)
Total CHOL/HDL Ratio: 4.6 Ratio

## 2011-01-18 LAB — COMPLETE METABOLIC PANEL WITH GFR
ALT: 55 U/L — ABNORMAL HIGH (ref 0–53)
AST: 31 U/L (ref 0–37)
Alkaline Phosphatase: 120 U/L — ABNORMAL HIGH (ref 39–117)
Sodium: 142 mEq/L (ref 135–145)
Total Bilirubin: 0.3 mg/dL (ref 0.3–1.2)
Total Protein: 7.4 g/dL (ref 6.0–8.3)

## 2011-01-18 LAB — HIV-1 RNA QUANT-NO REFLEX-BLD
HIV 1 RNA Quant: 34 copies/mL — ABNORMAL HIGH (ref <20)
HIV-1 RNA Quant, Log: 1.53 {Log} — ABNORMAL HIGH (ref <1.30)

## 2011-01-18 LAB — T-HELPER CELL (CD4) - (RCID CLINIC ONLY): CD4 T Cell Abs: 290 uL — ABNORMAL LOW (ref 400–2700)
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
## 2011-01-31 ENCOUNTER — Encounter: Payer: Self-pay | Admitting: Adult Health

## 2011-01-31 ENCOUNTER — Ambulatory Visit (INDEPENDENT_AMBULATORY_CARE_PROVIDER_SITE_OTHER): Payer: Medicare Other | Admitting: Adult Health

## 2011-01-31 VITALS — BP 114/80 | HR 76 | Temp 97.5°F | Ht 71.0 in | Wt 186.0 lb

## 2011-01-31 DIAGNOSIS — B20 Human immunodeficiency virus [HIV] disease: Secondary | ICD-10-CM

## 2011-01-31 MED ORDER — EMTRICITABINE-TENOFOVIR DF 200-300 MG PO TABS: 1.0000 | ORAL_TABLET | Freq: Every day | ORAL | Status: DC

## 2011-01-31 MED ORDER — RALTEGRAVIR POTASSIUM 400 MG PO TABS: 400.0000 mg | ORAL_TABLET | Freq: Two times a day (BID) | ORAL | Status: DC

## 2011-02-09 ENCOUNTER — Other Ambulatory Visit: Payer: Self-pay | Admitting: *Deleted

## 2011-02-09 DIAGNOSIS — E785 Hyperlipidemia, unspecified: Secondary | ICD-10-CM

## 2011-02-09 MED ORDER — PRAVASTATIN SODIUM 20 MG PO TABS: 20.0000 mg | ORAL_TABLET | Freq: Every day | ORAL | Status: DC

## 2011-06-29 ENCOUNTER — Other Ambulatory Visit: Payer: Self-pay | Admitting: Internal Medicine

## 2011-06-29 DIAGNOSIS — Z113 Encounter for screening for infections with a predominantly sexual mode of transmission: Secondary | ICD-10-CM

## 2011-07-12 ENCOUNTER — Other Ambulatory Visit: Payer: Medicare Other

## 2011-07-12 ENCOUNTER — Ambulatory Visit: Payer: Medicare Other

## 2011-07-12 DIAGNOSIS — Z113 Encounter for screening for infections with a predominantly sexual mode of transmission: Secondary | ICD-10-CM | POA: Diagnosis not present

## 2011-07-12 DIAGNOSIS — B20 Human immunodeficiency virus [HIV] disease: Secondary | ICD-10-CM | POA: Diagnosis not present

## 2011-07-12 DIAGNOSIS — E785 Hyperlipidemia, unspecified: Secondary | ICD-10-CM | POA: Diagnosis not present

## 2011-07-12 LAB — COMPLETE METABOLIC PANEL WITH GFR
AST: 26 U/L (ref 0–37)
Alkaline Phosphatase: 95 U/L (ref 39–117)
BUN: 14 mg/dL (ref 6–23)
Calcium: 9.1 mg/dL (ref 8.4–10.5)
Creat: 1.51 mg/dL — ABNORMAL HIGH (ref 0.50–1.35)
Total Bilirubin: 0.5 mg/dL (ref 0.3–1.2)

## 2011-07-12 LAB — CBC WITH DIFFERENTIAL/PLATELET
Basophils Relative: 0 % (ref 0–1)
Eosinophils Absolute: 0.1 10*3/uL (ref 0.0–0.7)
MCH: 29.9 pg (ref 26.0–34.0)
MCHC: 33.3 g/dL (ref 30.0–36.0)
Neutrophils Relative %: 66 % (ref 43–77)
Platelets: 217 10*3/uL (ref 150–400)
RBC: 4.81 MIL/uL (ref 4.22–5.81)

## 2011-07-12 LAB — LIPID PANEL
Cholesterol: 190 mg/dL (ref 0–200)
HDL: 31 mg/dL — ABNORMAL LOW (ref >39)
Total CHOL/HDL Ratio: 6.1 Ratio
Triglycerides: 97 mg/dL (ref <150)
VLDL: 19 mg/dL (ref 0–40)

## 2011-07-12 NOTE — Progress Notes (Signed)
Subjective:    Patient ID: Chad Moses is a 51 y.o. male.  Chief Complaint: HIV Follow-up Visit Chad Moses is here for follow-up of HIV infection. He is feeling unchanged since his last visit.  He claims continued adherence to therapy with good tolerance and no complications. There are not additional complaints.   Data Review: Diagnostic studies reviewed.  Review of Systems - General ROS: negative for - fatigue, fever, malaise or night sweats Psychological ROS: negative for - anxiety, behavioral disorder, concentration difficulties, depression or mood swings ENT ROS: negative for - headaches, oral lesions or sore throat Endocrine ROS: negative Respiratory ROS: no cough, shortness of breath, or wheezing Cardiovascular ROS: no chest pain or dyspnea on exertion Gastrointestinal ROS: no abdominal pain, change in bowel habits, or black or bloody stools Neurological ROS: no TIA or stroke symptoms Dermatological ROS: negative for rash and skin lesion changes  Objective:   General appearance: alert, cooperative and no distress Head: Normocephalic, without obvious abnormality, atraumatic Eyes: conjunctivae/corneas clear. PERRL, EOM's intact. Fundi benign. Throat: lips, mucosa, and tongue normal; teeth and gums normal Resp: clear to auscultation bilaterally Cardio: regular rate and rhythm, S1, S2 normal, no murmur, click, rub or gallop GI: soft, non-tender; bowel sounds normal; no masses,  no organomegaly Extremities: extremities normal, atraumatic, no cyanosis or edema Skin: Skin color, texture, turgor normal. No rashes or lesions Neurologic: Grossly normal Psych:  No vegetative signs or delusional behaviors noted.    Laboratory: From 01/17/2011 ,  CD4 count was 290 c/cmm @ 16 %. Viral load 34 copies/ml.     Assessment/Plan:   HIV DISEASE Clinically stable on current regimen. Continue present management.  Counseling provided on prevention of transmission of HIV. Condoms  offered:  Yes Medication adherence discussed with patient.  Follow up visit in 4 months with labs 2 weeks prior to appointment. Patient acknowledged information provided to them and agreed with plan of care.      Chad Heal A. Sundra Aland, MS, [facility] [phone]  07/12/2011, 7:52 AM

## 2011-07-12 NOTE — Assessment & Plan Note (Signed)
Clinically stable on current regimen. Continue present management.  Counseling provided on prevention of transmission of HIV. Condoms offered:  Yes Medication adherence discussed with patient.  Follow up visit in 4 months with labs 2 weeks prior to appointment. Patient acknowledged information provided to them and agreed with plan of care.    

## 2011-07-13 LAB — T-HELPER CELL (CD4) - (RCID CLINIC ONLY): CD4 % Helper T Cell: 15 % — ABNORMAL LOW (ref 33–55)

## 2011-07-14 LAB — HIV-1 RNA QUANT-NO REFLEX-BLD: HIV 1 RNA Quant: <20 copies/mL (ref <20)

## 2011-07-26 ENCOUNTER — Encounter: Payer: Self-pay | Admitting: Adult Health

## 2011-07-26 ENCOUNTER — Ambulatory Visit: Payer: Medicare Other | Admitting: Internal Medicine

## 2011-07-26 ENCOUNTER — Other Ambulatory Visit: Payer: Self-pay | Admitting: Adult Health

## 2011-07-26 ENCOUNTER — Ambulatory Visit (INDEPENDENT_AMBULATORY_CARE_PROVIDER_SITE_OTHER): Payer: Medicare Other | Admitting: Adult Health

## 2011-07-26 VITALS — BP 143/95 | HR 111 | Temp 98.2°F | Ht 71.0 in | Wt 208.0 lb

## 2011-07-26 DIAGNOSIS — Z0000 Encounter for general adult medical examination without abnormal findings: Secondary | ICD-10-CM

## 2011-07-26 DIAGNOSIS — J069 Acute upper respiratory infection, unspecified: Secondary | ICD-10-CM | POA: Diagnosis not present

## 2011-07-26 DIAGNOSIS — Z23 Encounter for immunization: Secondary | ICD-10-CM

## 2011-07-26 DIAGNOSIS — B20 Human immunodeficiency virus [HIV] disease: Secondary | ICD-10-CM | POA: Diagnosis not present

## 2011-07-26 DIAGNOSIS — I10 Essential (primary) hypertension: Secondary | ICD-10-CM

## 2011-07-26 MED ORDER — EMTRICITABINE-TENOFOVIR DF 200-300 MG PO TABS: 1.0000 | ORAL_TABLET | Freq: Every day | ORAL | Status: DC

## 2011-07-26 MED ORDER — HYDROCHLOROTHIAZIDE 25 MG PO TABS: 25.0000 mg | ORAL_TABLET | Freq: Every day | ORAL | Status: DC

## 2011-07-26 MED ORDER — RALTEGRAVIR POTASSIUM 400 MG PO TABS: 400.0000 mg | ORAL_TABLET | Freq: Two times a day (BID) | ORAL | Status: DC

## 2011-07-26 MED ORDER — ATENOLOL 100 MG PO TABS: 100.0000 mg | ORAL_TABLET | Freq: Every day | ORAL | Status: DC

## 2011-07-26 MED ORDER — ENALAPRIL MALEATE 10 MG PO TABS: 10.0000 mg | ORAL_TABLET | Freq: Every day | ORAL | Status: DC

## 2011-07-26 NOTE — Assessment & Plan Note (Signed)
Guaifenesin/Dextromethorphan15 mL every 4 hours when necessary  Ibuprofen. 600 mg every 6-8 hours when necessary , fever, and discomfort  Diphenhydramine.25-50 mg by mouth each bedtime when necessary  Cetirizine 10 mg by mouth every morning Bed rest for at least the next 2 days. Increase fluid intake. Warm packs over sinuses if needed. Proper nutrition with a least 3 meals a day. Contact clinic or go to urgent care. If symptoms worsen over the next 5 days or do not improve in the next 7 days.  

## 2011-07-26 NOTE — Assessment & Plan Note (Signed)
Begin hepatitis B series. Check hepatitis A, and body on next blood draw.

## 2011-07-26 NOTE — Assessment & Plan Note (Signed)
Clinically stable on current regimen. Continue present management.  Counseling provided on prevention of transmission of HIV. Condoms offered:  Yes Medication adherence discussed with patient. Medication refills ordered as needed. Referrals: None Follow up visit in 4 months with labs 2 weeks prior to appointment. Patient verbally acknowledged information provided to them and agreed with plan of care.

## 2011-07-26 NOTE — Progress Notes (Signed)
Subjective:    Patient ID: Chad Moses is a 51 y.o. male.  Chief Complaint: HIV Follow-up Visit Lysander Calixte is here for follow-up of HIV infection. He is feeling unchanged since his last visit.  He claims continued adherence to therapy with good tolerance and no complications. There are additional complaints.   Patient complains of 5 days of coryza, congestion, sneezing, nasal blockage, post nasal drip, productive cough, myalgias, headache and clear nasal discharge.     Data Review: Diagnostic studies reviewed.   Objective:   HEENT: mucosal irritation and Postnasal drainage Cardio: RRR Resp: CTA B/L GI: BS positive and Nontender Extremity:  Pulses positive and No Edema Skin:   Intact Neuro: Alert/Oriented, Cranial Nerve II-XII normal, Normal Sensory and Normal Motor Musc/Skel:  Normal  Psych:  No vegetative signs or delusional behaviors noted.    Laboratory:  HIV 1 RNA Quant (copies/mL)  Date Value  07/12/2011 <20   01/17/2011 34*  10/28/2010 <20      CD4 T Cell Abs (cmm)  Date Value  07/12/2011 220*  01/17/2011 290*  10/28/2010 230*     CD4 % Helper T Cell (%)  Date Value  07/12/2011 15*  01/17/2011 16*  10/28/2010 13*     Hep B S Ab (no units)  Date Value  01/17/2008 NEG      Hepatitis B Surface Ag (no units)  Date Value  01/17/2008 NEG      HCV Ab (no units)  Date Value  01/17/2008 NEG            Assessment/Plan:   Routine health maintenance Begin hepatitis B series. Check hepatitis A, and body on next blood draw.  URI (upper respiratory infection) Guaifenesin/Dextromethorphan15 mL every 4 hours when necessary  Ibuprofen. 600 mg every 6-8 hours when necessary , fever, and discomfort  Diphenhydramine.25-50 mg by mouth each bedtime when necessary  Cetirizine 10 mg by mouth every morning Bed rest for at least the next 2 days. Increase fluid intake. Warm packs over sinuses if needed. Proper nutrition with a least 3 meals a day. Contact clinic  or go to urgent care. If symptoms worsen over the next 5 days or do not improve in the next 7 days.    HIV DISEASE Clinically stable on current regimen. Continue present management.  Counseling provided on prevention of transmission of HIV. Condoms offered:  Yes Medication adherence discussed with patient. Medication refills ordered as needed. Referrals: None Follow up visit in 4 months with labs 2 weeks prior to appointment. Patient verbally acknowledged information provided to them and agreed with plan of care.       Lockie Bothun A. Sundra Aland, MS, [facility] [phone]  07/26/2011, 9:33 AM

## 2011-07-26 NOTE — Patient Instructions (Signed)
Guaifenesin/Dextromethorphan15 mL every 4 hours when necessary  Ibuprofen. 600 mg every 6-8 hours when necessary , fever, and discomfort  Diphenhydramine.25-50 mg by mouth each bedtime when necessary  Cetirizine 10 mg by mouth every morning Bed rest for at least the next 2 days. Increase fluid intake. Warm packs over sinuses if needed. Proper nutrition with a least 3 meals a day. Contact clinic or go to urgent care. If symptoms worsen over the next 5 days or do not improve in the next 7 days.  Return to clinic in 4-6 weeks for hepatitis B #2 injection.

## 2011-08-16 DIAGNOSIS — M109 Gout, unspecified: Secondary | ICD-10-CM | POA: Diagnosis not present

## 2011-08-16 DIAGNOSIS — R040 Epistaxis: Secondary | ICD-10-CM | POA: Diagnosis not present

## 2011-08-16 DIAGNOSIS — Z79899 Other long term (current) drug therapy: Secondary | ICD-10-CM | POA: Diagnosis not present

## 2011-08-16 DIAGNOSIS — M2550 Pain in unspecified joint: Secondary | ICD-10-CM | POA: Diagnosis not present

## 2011-08-16 DIAGNOSIS — Z21 Asymptomatic human immunodeficiency virus [HIV] infection status: Secondary | ICD-10-CM | POA: Diagnosis not present

## 2011-08-23 ENCOUNTER — Other Ambulatory Visit: Payer: Self-pay | Admitting: *Deleted

## 2011-08-23 ENCOUNTER — Ambulatory Visit (INDEPENDENT_AMBULATORY_CARE_PROVIDER_SITE_OTHER): Payer: Medicare Other

## 2011-08-23 DIAGNOSIS — Z23 Encounter for immunization: Secondary | ICD-10-CM

## 2011-08-23 DIAGNOSIS — I10 Essential (primary) hypertension: Secondary | ICD-10-CM

## 2011-08-23 DIAGNOSIS — R630 Anorexia: Secondary | ICD-10-CM

## 2011-08-23 DIAGNOSIS — Z0000 Encounter for general adult medical examination without abnormal findings: Secondary | ICD-10-CM | POA: Diagnosis present

## 2011-08-23 DIAGNOSIS — E785 Hyperlipidemia, unspecified: Secondary | ICD-10-CM

## 2011-08-23 DIAGNOSIS — B20 Human immunodeficiency virus [HIV] disease: Secondary | ICD-10-CM

## 2011-08-23 MED ORDER — ENALAPRIL MALEATE 10 MG PO TABS: 10.0000 mg | ORAL_TABLET | Freq: Every day | ORAL | Status: DC

## 2011-08-23 MED ORDER — EMTRICITABINE-TENOFOVIR DF 200-300 MG PO TABS: 1.0000 | ORAL_TABLET | Freq: Every day | ORAL | Status: DC

## 2011-08-23 MED ORDER — DRONABINOL 5 MG PO CAPS: 5.0000 mg | ORAL_CAPSULE | Freq: Three times a day (TID) | ORAL | Status: DC

## 2011-08-23 MED ORDER — RALTEGRAVIR POTASSIUM 400 MG PO TABS: 400.0000 mg | ORAL_TABLET | Freq: Two times a day (BID) | ORAL | Status: DC

## 2011-08-23 MED ORDER — HYDROCHLOROTHIAZIDE 25 MG PO TABS: 25.0000 mg | ORAL_TABLET | Freq: Every day | ORAL | Status: DC

## 2011-08-23 MED ORDER — ATENOLOL 100 MG PO TABS: 100.0000 mg | ORAL_TABLET | Freq: Every day | ORAL | Status: DC

## 2011-08-23 MED ORDER — PRAVASTATIN SODIUM 20 MG PO TABS: 20.0000 mg | ORAL_TABLET | Freq: Every day | ORAL | Status: DC

## 2011-08-26 ENCOUNTER — Telehealth: Payer: Self-pay | Admitting: *Deleted

## 2011-08-26 NOTE — Telephone Encounter (Signed)
[facility] is requiring a prior auth. The forms were faxed here & placed in Dr. Ephriam Knuckles mailbox

## 2011-08-31 ENCOUNTER — Other Ambulatory Visit: Payer: Self-pay | Admitting: Internal Medicine

## 2011-08-31 DIAGNOSIS — B20 Human immunodeficiency virus [HIV] disease: Secondary | ICD-10-CM

## 2011-09-05 ENCOUNTER — Telehealth: Payer: Self-pay | Admitting: *Deleted

## 2011-09-05 NOTE — Telephone Encounter (Signed)
Received fax request for prior authorization for Marinol from [facility], per Dr. Luciana Axe patient does not need this medication because according to his BMI he is overweight.  Faxed back to pharmacy with denial. Wendall Mola CMA

## 2011-09-09 ENCOUNTER — Other Ambulatory Visit: Payer: Self-pay | Admitting: *Deleted

## 2011-09-09 DIAGNOSIS — Z113 Encounter for screening for infections with a predominantly sexual mode of transmission: Secondary | ICD-10-CM

## 2011-09-14 DIAGNOSIS — M109 Gout, unspecified: Secondary | ICD-10-CM | POA: Diagnosis not present

## 2011-10-12 DIAGNOSIS — M109 Gout, unspecified: Secondary | ICD-10-CM | POA: Diagnosis not present

## 2011-11-03 ENCOUNTER — Other Ambulatory Visit (HOSPITAL_COMMUNITY)
Admission: RE | Admit: 2011-11-03 | Discharge: 2011-11-03 | Disposition: A | Discharge status: Home / Self Care | Payer: Medicare Other | Source: Ambulatory Visit | Attending: Internal Medicine | Admitting: Internal Medicine

## 2011-11-03 ENCOUNTER — Other Ambulatory Visit: Payer: Medicare Other

## 2011-11-03 DIAGNOSIS — B20 Human immunodeficiency virus [HIV] disease: Secondary | ICD-10-CM | POA: Diagnosis not present

## 2011-11-03 DIAGNOSIS — Z113 Encounter for screening for infections with a predominantly sexual mode of transmission: Secondary | ICD-10-CM | POA: Diagnosis not present

## 2011-11-03 LAB — CBC WITH DIFFERENTIAL/PLATELET
Eosinophils Absolute: 0.5 10*3/uL (ref 0.0–0.7)
Lymphs Abs: 2.3 10*3/uL (ref 0.7–4.0)
MCH: 29.9 pg (ref 26.0–34.0)
Neutro Abs: 3 10*3/uL (ref 1.7–7.7)
Neutrophils Relative %: 47 % (ref 43–77)
Platelets: 242 10*3/uL (ref 150–400)
RBC: 5.15 MIL/uL (ref 4.22–5.81)
WBC: 6.4 10*3/uL (ref 4.0–10.5)

## 2011-11-03 LAB — COMPREHENSIVE METABOLIC PANEL
ALT: 37 U/L (ref 0–53)
Alkaline Phosphatase: 116 U/L (ref 39–117)
CO2: 28 mEq/L (ref 19–32)
Potassium: 4.4 mEq/L (ref 3.5–5.3)
Sodium: 139 mEq/L (ref 135–145)
Total Bilirubin: 0.5 mg/dL (ref 0.3–1.2)
Total Protein: 8.3 g/dL (ref 6.0–8.3)

## 2011-11-04 LAB — T-HELPER CELL (CD4) - (RCID CLINIC ONLY): CD4 T Cell Abs: 390 uL — ABNORMAL LOW (ref 400–2700)

## 2011-11-04 LAB — HIV-1 RNA QUANT-NO REFLEX-BLD
HIV 1 RNA Quant: <20 copies/mL (ref <20)
HIV-1 RNA Quant, Log: <1.3 {Log} (ref <1.30)

## 2011-11-17 ENCOUNTER — Encounter: Payer: Self-pay | Admitting: Internal Medicine

## 2011-11-17 ENCOUNTER — Ambulatory Visit (INDEPENDENT_AMBULATORY_CARE_PROVIDER_SITE_OTHER): Payer: Medicare Other | Admitting: Internal Medicine

## 2011-11-17 ENCOUNTER — Telehealth: Payer: Self-pay | Admitting: *Deleted

## 2011-11-17 ENCOUNTER — Other Ambulatory Visit: Payer: Self-pay | Admitting: Internal Medicine

## 2011-11-17 VITALS — BP 153/80 | HR 85 | Temp 97.7°F | Ht 71.0 in | Wt 205.0 lb

## 2011-11-17 DIAGNOSIS — I10 Essential (primary) hypertension: Secondary | ICD-10-CM | POA: Diagnosis not present

## 2011-11-17 DIAGNOSIS — B20 Human immunodeficiency virus [HIV] disease: Secondary | ICD-10-CM

## 2011-11-17 DIAGNOSIS — N182 Chronic kidney disease, stage 2 (mild): Secondary | ICD-10-CM | POA: Diagnosis not present

## 2011-11-17 NOTE — Patient Instructions (Signed)
Return in 6 weeks for labs to check your kidney

## 2011-11-17 NOTE — Telephone Encounter (Signed)
Called patient to notify him of appointment with Dr. Nehemiah Settle at [facility] [place] on 12/06/11 at 2:30 pm. Wendall Mola CMA

## 2011-11-19 DIAGNOSIS — N182 Chronic kidney disease, stage 2 (mild): Secondary | ICD-10-CM | POA: Insufficient documentation

## 2011-11-19 NOTE — Assessment & Plan Note (Signed)
He is doing well on his current regimen but his creat has increased a bit and GFR is around 50, which will require a dose adjustment if decreases any more.  I may need to change him to Epzicom and so will check his HLA [id number] and repeat creat in 2 months.

## 2011-11-19 NOTE — Assessment & Plan Note (Signed)
This continues to be elevated.  I did discuss with him dietary modifications including reducing his salt intake by stopping fast food eating, reducing his weight with reduction in beer that he drinks regularly and increasing his exercise.  He is going to try these lifestyle modifications.  I also have referred him to a PCP to address the non-HIV issues.

## 2011-11-19 NOTE — Assessment & Plan Note (Signed)
It has been relatively stable but I am concerned with his BP being elevated despite meds that it could worsen.  Tenofovir could also be contributing which he has been on for years and so will consider changing to Epzicom.

## 2011-11-19 NOTE — Progress Notes (Signed)
  Subjective:    Patient ID: Chad Moses, male    DOB: 1960/10/11, 51 y.o.   MRN: [id number]  HPI Here for follow up of HIV.  He continues on Isentress and Truvada, changed in 2012 from [place], norvir, Viread and Videx.  No history of resistance and he has remained at or near undetectable since 2010.  He has no complaints.  Denies any weight loss, headache, urinary frequency.  He does eat fast food regularly and is interested in reducing the amount of medications he takes, particularly with the BP meds.     Review of Systems  Constitutional: Negative for fever, appetite change, fatigue and unexpected weight change.  HENT: Negative for sore throat and trouble swallowing.   Respiratory: Negative for cough and shortness of breath.   Cardiovascular: Negative for chest pain, palpitations and leg swelling.  Gastrointestinal: Negative for nausea, abdominal pain and diarrhea.  Genitourinary: Negative for dysuria, frequency, hematuria and enuresis.  Musculoskeletal: Negative for myalgias, joint swelling and arthralgias.  Skin: Negative for pallor and rash.  Neurological: Negative for dizziness, weakness, numbness and headaches.  Hematological: Negative for adenopathy.  Psychiatric/Behavioral: Negative for dysphoric mood. The patient is not nervous/anxious.        Objective:   Physical Exam  Constitutional: He appears well-developed and well-nourished. No distress.  HENT:  Mouth/Throat: Oropharynx is clear and moist. No oropharyngeal exudate.  Cardiovascular: Normal rate, regular rhythm and normal heart sounds.  Exam reveals no gallop and no friction rub.   No murmur heard. Pulmonary/Chest: Effort normal and breath sounds normal. No respiratory distress. He has no wheezes. He has no rales.  Abdominal: Soft. Bowel sounds are normal. He exhibits no distension. There is no tenderness.  Lymphadenopathy:    He has no cervical adenopathy.  Skin: Skin is warm and dry. No rash noted.         Assessment & Plan:

## 2011-11-24 ENCOUNTER — Ambulatory Visit: Payer: Medicare Other | Admitting: Family Medicine

## 2011-12-06 DIAGNOSIS — I10 Essential (primary) hypertension: Secondary | ICD-10-CM | POA: Diagnosis not present

## 2011-12-06 DIAGNOSIS — L609 Nail disorder, unspecified: Secondary | ICD-10-CM | POA: Diagnosis not present

## 2011-12-06 DIAGNOSIS — N189 Chronic kidney disease, unspecified: Secondary | ICD-10-CM | POA: Diagnosis not present

## 2011-12-19 DIAGNOSIS — M898X9 Other specified disorders of bone, unspecified site: Secondary | ICD-10-CM | POA: Diagnosis not present

## 2011-12-19 DIAGNOSIS — L84 Corns and callosities: Secondary | ICD-10-CM | POA: Diagnosis not present

## 2011-12-19 DIAGNOSIS — M216X9 Other acquired deformities of unspecified foot: Secondary | ICD-10-CM | POA: Diagnosis not present

## 2011-12-19 DIAGNOSIS — B351 Tinea unguium: Secondary | ICD-10-CM | POA: Diagnosis not present

## 2012-01-03 ENCOUNTER — Other Ambulatory Visit: Payer: Medicare Other

## 2012-01-03 DIAGNOSIS — H9209 Otalgia, unspecified ear: Secondary | ICD-10-CM | POA: Diagnosis not present

## 2012-01-03 DIAGNOSIS — M25559 Pain in unspecified hip: Secondary | ICD-10-CM | POA: Diagnosis not present

## 2012-01-17 ENCOUNTER — Encounter: Payer: Self-pay | Admitting: Internal Medicine

## 2012-01-17 ENCOUNTER — Ambulatory Visit (INDEPENDENT_AMBULATORY_CARE_PROVIDER_SITE_OTHER): Payer: Medicare Other | Admitting: Internal Medicine

## 2012-01-17 VITALS — BP 122/77 | HR 81 | Temp 98.4°F | Ht 71.0 in | Wt 196.0 lb

## 2012-01-17 DIAGNOSIS — M109 Gout, unspecified: Secondary | ICD-10-CM | POA: Diagnosis not present

## 2012-01-17 DIAGNOSIS — B20 Human immunodeficiency virus [HIV] disease: Secondary | ICD-10-CM

## 2012-01-17 DIAGNOSIS — N182 Chronic kidney disease, stage 2 (mild): Secondary | ICD-10-CM | POA: Diagnosis not present

## 2012-01-17 LAB — COMPLETE METABOLIC PANEL WITH GFR
AST: 22 U/L (ref 0–37)
Albumin: 4.4 g/dL (ref 3.5–5.2)
Alkaline Phosphatase: 121 U/L — ABNORMAL HIGH (ref 39–117)
BUN: 16 mg/dL (ref 6–23)
Potassium: 4.2 mEq/L (ref 3.5–5.3)

## 2012-01-17 LAB — CBC WITH DIFFERENTIAL/PLATELET
Eosinophils Relative: 3 % (ref 0–5)
HCT: 42 % (ref 39.0–52.0)
Hemoglobin: 15.1 g/dL (ref 13.0–17.0)
Lymphocytes Relative: 26 % (ref 12–46)
Lymphs Abs: 1.6 10*3/uL (ref 0.7–4.0)
MCV: 84 fL (ref 78.0–100.0)
Monocytes Absolute: 0.6 10*3/uL (ref 0.1–1.0)
Monocytes Relative: 9 % (ref 3–12)
RBC: 5 MIL/uL (ref 4.22–5.81)
RDW: 14.6 % (ref 11.5–15.5)
WBC: 6 10*3/uL (ref 4.0–10.5)

## 2012-01-17 MED ORDER — COLCHICINE 0.6 MG PO TABS: 0.6000 mg | ORAL_TABLET | Freq: Two times a day (BID) | ORAL | Status: DC

## 2012-01-17 NOTE — Progress Notes (Signed)
  Subjective:    Patient ID: Chad Moses, male    DOB: 1960/08/03, 51 y.o.   MRN: [id number]  HPI hhe comes in here for followup of his HIV. He was last seen about 2 months ago and noted to have elevated blood pressure and his creatinine had increased some period at that time I did discuss dietary modifications and he has been seen by a primary care physician. He does tell me that he is significantly reduced his salt intake and his fast food intake. He does feel better. He does tell me he has had what seems like a recent gout attack of his ankle. He has been out of his colchicine.   Review of Systems  Constitutional: Negative for fever, chills, fatigue and unexpected weight change.  HENT: Negative for sore throat and trouble swallowing.   Respiratory: Negative for cough and shortness of breath.   Cardiovascular: Negative for chest pain, palpitations and leg swelling.  Gastrointestinal: Negative for diarrhea, constipation and abdominal distention.  Musculoskeletal: Positive for joint swelling. Negative for myalgias and back pain.  Skin: Negative for rash.  Neurological: Negative for dizziness and headaches.  Hematological: Negative for adenopathy.  Psychiatric/Behavioral: Negative for dysphoric mood. The patient is not nervous/anxious.        Objective:   Physical Exam  Constitutional: He appears well-developed and well-nourished. No distress.  Cardiovascular: Normal rate, regular rhythm and normal heart sounds.  Exam reveals no gallop and no friction rub.   No murmur heard. Pulmonary/Chest: Effort normal and breath sounds normal. No respiratory distress. He has no wheezes. He has no rales.          Assessment & Plan:

## 2012-01-17 NOTE — Assessment & Plan Note (Signed)
I refilled his colchicine.

## 2012-01-17 NOTE — Assessment & Plan Note (Signed)
I am concerned about his creatinine. He has not had labs prior to the visit so I will check them today. I did discuss that if it is worsened, I will need to consider changing his therapy. He is aware of this. I do need to change his therapy he will return in 2 months on his new therapy to repeat his labs, otherwise I will see him in 4 months. I will check an [id number] 4 the possible use of abacavir.

## 2012-01-17 NOTE — Assessment & Plan Note (Signed)
I will check his creatinine today. 

## 2012-01-18 LAB — HIV-1 RNA QUANT-NO REFLEX-BLD
HIV 1 RNA Quant: <20 copies/mL (ref <20)
HIV-1 RNA Quant, Log: <1.3 {Log} (ref <1.30)

## 2012-01-18 LAB — T-HELPER CELL (CD4) - (RCID CLINIC ONLY)
CD4 % Helper T Cell: 20 % — ABNORMAL LOW (ref 33–55)
CD4 T Cell Abs: 310 uL — ABNORMAL LOW (ref 400–2700)

## 2012-01-18 LAB — HEPATITIS A ANTIBODY, TOTAL: Hep A Total Ab: POSITIVE — AB

## 2012-02-14 DIAGNOSIS — M109 Gout, unspecified: Secondary | ICD-10-CM | POA: Diagnosis not present

## 2012-02-14 DIAGNOSIS — Z79899 Other long term (current) drug therapy: Secondary | ICD-10-CM | POA: Diagnosis not present

## 2012-02-14 DIAGNOSIS — M2550 Pain in unspecified joint: Secondary | ICD-10-CM | POA: Diagnosis not present

## 2012-02-28 ENCOUNTER — Other Ambulatory Visit: Payer: Self-pay | Admitting: *Deleted

## 2012-02-28 DIAGNOSIS — B20 Human immunodeficiency virus [HIV] disease: Secondary | ICD-10-CM

## 2012-02-28 MED ORDER — EMTRICITABINE-TENOFOVIR DF 200-300 MG PO TABS: 1.0000 | ORAL_TABLET | Freq: Every day | ORAL | Status: DC

## 2012-02-28 MED ORDER — RALTEGRAVIR POTASSIUM 400 MG PO TABS: 400.0000 mg | ORAL_TABLET | Freq: Two times a day (BID) | ORAL | Status: DC

## 2012-03-12 DIAGNOSIS — B351 Tinea unguium: Secondary | ICD-10-CM | POA: Diagnosis not present

## 2012-03-15 DIAGNOSIS — M109 Gout, unspecified: Secondary | ICD-10-CM | POA: Diagnosis not present

## 2012-03-30 ENCOUNTER — Other Ambulatory Visit: Payer: Self-pay | Admitting: *Deleted

## 2012-03-30 DIAGNOSIS — I10 Essential (primary) hypertension: Secondary | ICD-10-CM

## 2012-03-30 DIAGNOSIS — E785 Hyperlipidemia, unspecified: Secondary | ICD-10-CM

## 2012-03-30 MED ORDER — ATENOLOL 100 MG PO TABS: 100.0000 mg | ORAL_TABLET | Freq: Every day | ORAL | Status: DC

## 2012-03-30 MED ORDER — HYDROCHLOROTHIAZIDE 25 MG PO TABS: 25.0000 mg | ORAL_TABLET | Freq: Every day | ORAL | Status: DC

## 2012-03-30 MED ORDER — ENALAPRIL MALEATE 10 MG PO TABS: 10.0000 mg | ORAL_TABLET | Freq: Every day | ORAL | Status: DC

## 2012-03-30 MED ORDER — PRAVASTATIN SODIUM 20 MG PO TABS: 20.0000 mg | ORAL_TABLET | Freq: Every day | ORAL | Status: DC

## 2012-05-21 ENCOUNTER — Other Ambulatory Visit: Payer: Self-pay | Admitting: *Deleted

## 2012-05-21 DIAGNOSIS — B20 Human immunodeficiency virus [HIV] disease: Secondary | ICD-10-CM

## 2012-06-04 ENCOUNTER — Other Ambulatory Visit (INDEPENDENT_AMBULATORY_CARE_PROVIDER_SITE_OTHER): Payer: Medicare Other

## 2012-06-04 DIAGNOSIS — B20 Human immunodeficiency virus [HIV] disease: Secondary | ICD-10-CM

## 2012-06-04 LAB — COMPLETE METABOLIC PANEL WITH GFR
ALT: 36 U/L (ref 0–53)
AST: 20 U/L (ref 0–37)
CO2: 27 mEq/L (ref 19–32)
Chloride: 102 mEq/L (ref 96–112)
GFR, Est African American: 51 mL/min — ABNORMAL LOW
Sodium: 138 mEq/L (ref 135–145)
Total Bilirubin: 0.5 mg/dL (ref 0.3–1.2)
Total Protein: 7.3 g/dL (ref 6.0–8.3)

## 2012-06-04 LAB — CBC WITH DIFFERENTIAL/PLATELET
Basophils Absolute: 0.1 10*3/uL (ref 0.0–0.1)
Basophils Relative: 1 % (ref 0–1)
HCT: 45.6 % (ref 39.0–52.0)
Lymphocytes Relative: 41 % (ref 12–46)
MCHC: 34.6 g/dL (ref 30.0–36.0)
Monocytes Absolute: 0.6 10*3/uL (ref 0.1–1.0)
Neutro Abs: 3.2 10*3/uL (ref 1.7–7.7)
Neutrophils Relative %: 45 % (ref 43–77)
RDW: 15.3 % (ref 11.5–15.5)
WBC: 7.1 10*3/uL (ref 4.0–10.5)

## 2012-06-05 LAB — T-HELPER CELL (CD4) - (RCID CLINIC ONLY): CD4 T Cell Abs: 480 uL (ref 400–2700)

## 2012-06-19 ENCOUNTER — Encounter: Payer: Self-pay | Admitting: Internal Medicine

## 2012-06-19 ENCOUNTER — Ambulatory Visit (INDEPENDENT_AMBULATORY_CARE_PROVIDER_SITE_OTHER): Payer: Medicare Other | Admitting: Internal Medicine

## 2012-06-19 VITALS — BP 144/85 | HR 81 | Temp 97.7°F | Ht 71.0 in | Wt 204.0 lb

## 2012-06-19 DIAGNOSIS — Z79899 Other long term (current) drug therapy: Secondary | ICD-10-CM

## 2012-06-19 DIAGNOSIS — B20 Human immunodeficiency virus [HIV] disease: Secondary | ICD-10-CM

## 2012-06-19 DIAGNOSIS — N182 Chronic kidney disease, stage 2 (mild): Secondary | ICD-10-CM | POA: Diagnosis not present

## 2012-06-19 DIAGNOSIS — Z23 Encounter for immunization: Secondary | ICD-10-CM | POA: Diagnosis not present

## 2012-06-19 DIAGNOSIS — Z21 Asymptomatic human immunodeficiency virus [HIV] infection status: Secondary | ICD-10-CM | POA: Diagnosis not present

## 2012-06-19 DIAGNOSIS — H538 Other visual disturbances: Secondary | ICD-10-CM | POA: Insufficient documentation

## 2012-06-19 DIAGNOSIS — Z113 Encounter for screening for infections with a predominantly sexual mode of transmission: Secondary | ICD-10-CM

## 2012-06-19 DIAGNOSIS — I10 Essential (primary) hypertension: Secondary | ICD-10-CM | POA: Diagnosis not present

## 2012-06-19 NOTE — Assessment & Plan Note (Signed)
He is doing well in his medication and will continue with the same. He will return in 4 months for repeat labs and I will check his creatinine. I did discuss with him that his creatinine is stable though for those elevated all I may need to change his therapy.

## 2012-06-19 NOTE — Progress Notes (Signed)
  Subjective:    Patient ID: Chad Moses, male    DOB: 01/18/1961, 52 y.o.   MRN: [id number]  HPI He is here for follow up of his HIV. He continues to take his medicine well with no missed doses. He is on darunavir, Norvir and Truvada.  He continues to take his blood pressure medicine with no new issues. He denies any headache. He continues to smoke.   Review of Systems  Constitutional: Negative for fever, fatigue and unexpected weight change.  HENT: Negative for sore throat and trouble swallowing.   Respiratory: Negative for cough.   Cardiovascular: Negative for chest pain, palpitations and leg swelling.  Gastrointestinal: Negative for nausea, abdominal pain and diarrhea.  Genitourinary: Negative for urgency and difficulty urinating.  Musculoskeletal: Negative for myalgias and arthralgias.  Neurological: Negative for dizziness, light-headedness and headaches.       Objective:   Physical Exam  Constitutional: He appears well-developed and well-nourished. No distress.  HENT:  Mouth/Throat: Oropharynx is clear and moist. No oropharyngeal exudate.  Cardiovascular: Normal rate, regular rhythm and normal heart sounds.  Exam reveals no gallop and no friction rub.   No murmur heard. Pulmonary/Chest: Breath sounds normal. No respiratory distress. He has no wheezes. He has no rales.  Abdominal: Soft. Bowel sounds are normal. He exhibits no distension. There is no tenderness. There is no rebound.          Assessment & Plan:

## 2012-06-19 NOTE — Assessment & Plan Note (Signed)
He is having problems with his vision. I am going to have him referred for vision  More than 40 minutes was spent with exam, counseling and coordination of care for all his problems.

## 2012-06-19 NOTE — Assessment & Plan Note (Signed)
I will continue to monitor this those GFR at this time is over 50.

## 2012-06-19 NOTE — Assessment & Plan Note (Signed)
His blood pressure is mildly elevated. I did discuss with him at length that smoking cessation may be the most important thing for him. He was given information for the [facility] quit line.

## 2012-06-21 DIAGNOSIS — M79609 Pain in unspecified limb: Secondary | ICD-10-CM | POA: Diagnosis not present

## 2012-06-21 DIAGNOSIS — L84 Corns and callosities: Secondary | ICD-10-CM | POA: Diagnosis not present

## 2012-06-21 DIAGNOSIS — B351 Tinea unguium: Secondary | ICD-10-CM | POA: Diagnosis not present

## 2012-06-27 ENCOUNTER — Other Ambulatory Visit: Payer: Self-pay | Admitting: *Deleted

## 2012-06-27 DIAGNOSIS — I10 Essential (primary) hypertension: Secondary | ICD-10-CM

## 2012-06-27 MED ORDER — HYDROCHLOROTHIAZIDE 25 MG PO TABS: 25.0000 mg | ORAL_TABLET | Freq: Every day | ORAL | Status: DC

## 2012-06-27 MED ORDER — ATENOLOL 100 MG PO TABS: 100.0000 mg | ORAL_TABLET | Freq: Every day | ORAL | Status: DC

## 2012-07-18 ENCOUNTER — Other Ambulatory Visit: Payer: Self-pay | Admitting: Licensed Clinical Social Worker

## 2012-07-18 DIAGNOSIS — I10 Essential (primary) hypertension: Secondary | ICD-10-CM

## 2012-07-18 MED ORDER — ENALAPRIL MALEATE 10 MG PO TABS: 10.0000 mg | ORAL_TABLET | Freq: Every day | ORAL | Status: DC

## 2012-08-14 DIAGNOSIS — Z79899 Other long term (current) drug therapy: Secondary | ICD-10-CM | POA: Diagnosis not present

## 2012-08-14 DIAGNOSIS — M2550 Pain in unspecified joint: Secondary | ICD-10-CM | POA: Diagnosis not present

## 2012-08-14 DIAGNOSIS — M109 Gout, unspecified: Secondary | ICD-10-CM | POA: Diagnosis not present

## 2012-10-18 ENCOUNTER — Other Ambulatory Visit: Payer: Medicare Other

## 2012-10-18 ENCOUNTER — Other Ambulatory Visit: Payer: Self-pay | Admitting: Infectious Disease

## 2012-10-18 ENCOUNTER — Other Ambulatory Visit: Payer: Self-pay | Admitting: Internal Medicine

## 2012-10-18 DIAGNOSIS — Z21 Asymptomatic human immunodeficiency virus [HIV] infection status: Secondary | ICD-10-CM | POA: Diagnosis not present

## 2012-10-18 DIAGNOSIS — Z79899 Other long term (current) drug therapy: Secondary | ICD-10-CM

## 2012-10-18 DIAGNOSIS — Z113 Encounter for screening for infections with a predominantly sexual mode of transmission: Secondary | ICD-10-CM | POA: Diagnosis not present

## 2012-10-18 DIAGNOSIS — B20 Human immunodeficiency virus [HIV] disease: Secondary | ICD-10-CM | POA: Diagnosis not present

## 2012-10-18 DIAGNOSIS — R7309 Other abnormal glucose: Secondary | ICD-10-CM

## 2012-10-18 LAB — CBC WITH DIFFERENTIAL/PLATELET
Basophils Absolute: 0 10*3/uL (ref 0.0–0.1)
HCT: 45.3 % (ref 39.0–52.0)
Hemoglobin: 15.5 g/dL (ref 13.0–17.0)
Lymphocytes Relative: 25 % (ref 12–46)
Monocytes Absolute: 0.2 10*3/uL (ref 0.1–1.0)
Neutro Abs: 4 10*3/uL (ref 1.7–7.7)
Neutrophils Relative %: 65 % (ref 43–77)
RDW: 16.5 % — ABNORMAL HIGH (ref 11.5–15.5)
WBC: 6.1 10*3/uL (ref 4.0–10.5)

## 2012-10-18 LAB — COMPLETE METABOLIC PANEL WITH GFR
ALT: 32 U/L (ref 0–53)
AST: 16 U/L (ref 0–37)
Albumin: 4.4 g/dL (ref 3.5–5.2)
Alkaline Phosphatase: 92 U/L (ref 39–117)
BUN: 14 mg/dL (ref 6–23)
Calcium: 9.3 mg/dL (ref 8.4–10.5)
Chloride: 104 mEq/L (ref 96–112)
Potassium: 4.2 mEq/L (ref 3.5–5.3)
Sodium: 138 mEq/L (ref 135–145)
Total Protein: 7.1 g/dL (ref 6.0–8.3)

## 2012-10-18 LAB — RPR

## 2012-10-18 LAB — LIPID PANEL
HDL: 31 mg/dL — ABNORMAL LOW (ref >39)
LDL Cholesterol: 148 mg/dL — ABNORMAL HIGH (ref 0–99)
Total CHOL/HDL Ratio: 6.8 Ratio
VLDL: 32 mg/dL (ref 0–40)

## 2012-10-19 ENCOUNTER — Other Ambulatory Visit: Payer: Self-pay | Admitting: Internal Medicine

## 2012-10-19 DIAGNOSIS — R7309 Other abnormal glucose: Secondary | ICD-10-CM

## 2012-10-19 LAB — T-HELPER CELL (CD4) - (RCID CLINIC ONLY)
CD4 % Helper T Cell: 11 % — ABNORMAL LOW (ref 33–55)
CD4 T Cell Abs: 170 uL — ABNORMAL LOW (ref 400–2700)

## 2012-10-19 LAB — HIV-1 RNA QUANT-NO REFLEX-BLD
HIV 1 RNA Quant: <20 copies/mL (ref <20)
HIV-1 RNA Quant, Log: <1.3 {Log} (ref <1.30)

## 2012-10-19 LAB — HEMOGLOBIN A1C: Mean Plasma Glucose: 128 mg/dL — ABNORMAL HIGH (ref <117)

## 2012-10-19 NOTE — Addendum Note (Signed)
Addended by: Mariea Clonts D on: 10/19/2012 01:11 PM   Modules accepted: Orders

## 2012-11-01 ENCOUNTER — Encounter: Payer: Self-pay | Admitting: Internal Medicine

## 2012-11-01 ENCOUNTER — Ambulatory Visit (INDEPENDENT_AMBULATORY_CARE_PROVIDER_SITE_OTHER): Payer: Medicare Other | Admitting: Internal Medicine

## 2012-11-01 VITALS — BP 118/83 | HR 76 | Temp 97.7°F | Ht 71.0 in | Wt 200.0 lb

## 2012-11-01 DIAGNOSIS — Z720 Tobacco use: Secondary | ICD-10-CM | POA: Insufficient documentation

## 2012-11-01 DIAGNOSIS — I10 Essential (primary) hypertension: Secondary | ICD-10-CM | POA: Diagnosis not present

## 2012-11-01 DIAGNOSIS — F17200 Nicotine dependence, unspecified, uncomplicated: Secondary | ICD-10-CM

## 2012-11-01 DIAGNOSIS — Z0000 Encounter for general adult medical examination without abnormal findings: Secondary | ICD-10-CM

## 2012-11-01 DIAGNOSIS — N182 Chronic kidney disease, stage 2 (mild): Secondary | ICD-10-CM | POA: Diagnosis not present

## 2012-11-01 DIAGNOSIS — Z23 Encounter for immunization: Secondary | ICD-10-CM

## 2012-11-01 DIAGNOSIS — B20 Human immunodeficiency virus [HIV] disease: Secondary | ICD-10-CM | POA: Diagnosis not present

## 2012-11-01 NOTE — Assessment & Plan Note (Signed)
Discussed importance of cessation.  He is motivated to quit.  Gave info for [facility] quit line.

## 2012-11-01 NOTE — Progress Notes (Signed)
  Subjective:    Patient ID: Chad Moses, male    DOB: [birth year], 52 y.o.   MRN: [id number]  HPI He comes in for follow up of hiv.  Continues on Isentress and Truvada.  Denies missed doses.  CD 4 has dipped to 170, viral load remains undetectable.  No weight loss, no diarrhea. Continuing to try to quit smoking, struggling.  Trying cold [place].   BP good today, he is hopeful not to have to take meds in future if can quit.     Review of Systems  Constitutional: Negative for fatigue and unexpected weight change.  HENT: Negative for sore throat and trouble swallowing.   Cardiovascular: Negative for chest pain and leg swelling.  Gastrointestinal: Negative for diarrhea.  Skin: Negative for rash.  Neurological: Negative for light-headedness and headaches.       Objective:   Physical Exam  Constitutional: He appears well-developed and well-nourished. No distress.  HENT:  Mouth/Throat: Oropharynx is clear and moist. No oropharyngeal exudate.  Cardiovascular: Normal rate, regular rhythm and normal heart sounds.  Exam reveals no gallop and no friction rub.   No murmur heard. Pulmonary/Chest: Effort normal and breath sounds normal. No respiratory distress. He has no wheezes.  Lymphadenopathy:    He has no cervical adenopathy.          Assessment & Plan:

## 2012-11-01 NOTE — Addendum Note (Signed)
Addended by: Wendall Mola A on: 11/01/2012 10:00 AM   Modules accepted: Orders

## 2012-11-01 NOTE — Assessment & Plan Note (Signed)
CD 4 likely transiently decreased since vl undetectable and endorses perfect compliance.  Will recheck in 2 months and further discuss smoking cessation.

## 2012-11-01 NOTE — Assessment & Plan Note (Signed)
pneumovax

## 2012-11-01 NOTE — Assessment & Plan Note (Signed)
Remains stable with a GFR over 50.

## 2012-11-01 NOTE — Assessment & Plan Note (Signed)
Much improved

## 2012-12-18 ENCOUNTER — Other Ambulatory Visit: Payer: Medicare Other

## 2012-12-18 DIAGNOSIS — B20 Human immunodeficiency virus [HIV] disease: Secondary | ICD-10-CM

## 2012-12-18 LAB — CBC WITH DIFFERENTIAL/PLATELET
Eosinophils Relative: 3 % (ref 0–5)
HCT: 43.6 % (ref 39.0–52.0)
Lymphocytes Relative: 45 % (ref 12–46)
Lymphs Abs: 2.4 10*3/uL (ref 0.7–4.0)
MCV: 86.5 fL (ref 78.0–100.0)
Monocytes Absolute: 0.5 10*3/uL (ref 0.1–1.0)
Neutro Abs: 2.3 10*3/uL (ref 1.7–7.7)
RBC: 5.04 MIL/uL (ref 4.22–5.81)
WBC: 5.4 10*3/uL (ref 4.0–10.5)

## 2012-12-19 LAB — COMPLETE METABOLIC PANEL WITH GFR
AST: 35 U/L (ref 0–37)
Albumin: 4.2 g/dL (ref 3.5–5.2)
Alkaline Phosphatase: 135 U/L — ABNORMAL HIGH (ref 39–117)
Calcium: 9.9 mg/dL (ref 8.4–10.5)
Chloride: 102 mEq/L (ref 96–112)
Glucose, Bld: 116 mg/dL — ABNORMAL HIGH (ref 70–99)
Potassium: 3.8 mEq/L (ref 3.5–5.3)
Sodium: 135 mEq/L (ref 135–145)
Total Protein: 7.9 g/dL (ref 6.0–8.3)

## 2012-12-19 LAB — T-HELPER CELL (CD4) - (RCID CLINIC ONLY): CD4 % Helper T Cell: 20 % — ABNORMAL LOW (ref 33–55)

## 2012-12-19 LAB — HIV-1 RNA QUANT-NO REFLEX-BLD: HIV 1 RNA Quant: 798 copies/mL — ABNORMAL HIGH (ref <20)

## 2013-01-01 ENCOUNTER — Other Ambulatory Visit: Payer: Self-pay | Admitting: *Deleted

## 2013-01-01 ENCOUNTER — Encounter: Payer: Self-pay | Admitting: Internal Medicine

## 2013-01-01 ENCOUNTER — Ambulatory Visit (INDEPENDENT_AMBULATORY_CARE_PROVIDER_SITE_OTHER): Payer: Medicare Other | Admitting: Internal Medicine

## 2013-01-01 VITALS — BP 145/79 | HR 81 | Temp 97.7°F | Ht 71.0 in | Wt 195.0 lb

## 2013-01-01 DIAGNOSIS — N182 Chronic kidney disease, stage 2 (mild): Secondary | ICD-10-CM

## 2013-01-01 DIAGNOSIS — R630 Anorexia: Secondary | ICD-10-CM | POA: Diagnosis not present

## 2013-01-01 DIAGNOSIS — B20 Human immunodeficiency virus [HIV] disease: Secondary | ICD-10-CM | POA: Diagnosis not present

## 2013-01-01 MED ORDER — EMTRICITABINE-TENOFOVIR DF 200-300 MG PO TABS: 1.0000 | ORAL_TABLET | Freq: Every day | ORAL | Status: DC

## 2013-01-01 MED ORDER — TRIAMCINOLONE ACETONIDE 0.025 % EX LOTN: TOPICAL_LOTION | CUTANEOUS | Status: DC

## 2013-01-01 MED ORDER — DOLUTEGRAVIR SODIUM 50 MG PO TABS: 50.0000 mg | ORAL_TABLET | Freq: Every day | ORAL | Status: DC

## 2013-01-01 MED ORDER — DRONABINOL 5 MG PO CAPS: 5.0000 mg | ORAL_CAPSULE | Freq: Three times a day (TID) | ORAL | Status: DC

## 2013-01-01 NOTE — Assessment & Plan Note (Signed)
His creatinine is in the normal range and so no dose adjustment required.

## 2013-01-01 NOTE — Progress Notes (Signed)
  Subjective:    Patient ID: Chad Moses, male    DOB: [birth year], 52 y.o.   MRN: [id number]  HPI Comes in here for followup of his HIV. He has been on Isentress and Truvada. Unfortunately, he is out of refills and has been stretching his current prescription out. It appears that he did not call and so has been taking his medications essentially every other day. His viral load does reflect that which is now up to 798 after being undetectable.  Overall it is unclear why he was unable to get refills but it really appears that he didn't call us to assure that there were refills. He has had no weight loss, diarrhea or new issues   Review of Systems  Constitutional: Negative for fatigue.  HENT: Negative for sore throat, mouth sores and trouble swallowing.   Respiratory: Negative for cough and shortness of breath.   Cardiovascular: Negative for leg swelling.  Gastrointestinal: Negative for nausea and diarrhea.  Musculoskeletal: Negative for myalgias and arthralgias.  Skin: Negative for rash.  Neurological: Negative for dizziness, light-headedness and headaches.  Hematological: Negative for adenopathy.  Psychiatric/Behavioral: Negative for dysphoric mood.       Objective:   Physical Exam  Constitutional: He is oriented to person, place, and time. He appears well-developed and well-nourished. No distress.  HENT:  Mouth/Throat: No oropharyngeal exudate.  Eyes: Right eye exhibits no discharge. Left eye exhibits no discharge. No scleral icterus.  Cardiovascular: Normal rate, regular rhythm and normal heart sounds.   No murmur heard. Pulmonary/Chest: Effort normal and breath sounds normal. No respiratory distress. He has no wheezes.  Lymphadenopathy:    He has no cervical adenopathy.  Neurological: He is alert and oriented to person, place, and time.  Skin: Skin is warm and dry. No rash noted.  Psychiatric: He has a normal mood and affect.          Assessment & Plan:

## 2013-01-01 NOTE — Assessment & Plan Note (Signed)
Unfortunately he has been sporadically taking his medications. I did emphasize that he can call at any time if that occurs in the future. I am going to send refills and him going to start him on tivicay in place of the Isentress for ease of dosing and she will continue Truvada. I will then check his labs in about 2 weeks back on the medication and I will see him in 2 months following that if the labs are reassuring.

## 2013-01-01 NOTE — Addendum Note (Signed)
Addended by: Gardiner Barefoot on: 01/01/2013 05:07 PM   Modules accepted: Orders

## 2013-01-16 ENCOUNTER — Other Ambulatory Visit: Payer: Medicare Other

## 2013-01-16 DIAGNOSIS — B20 Human immunodeficiency virus [HIV] disease: Secondary | ICD-10-CM | POA: Diagnosis not present

## 2013-01-17 LAB — HIV-1 RNA ULTRAQUANT REFLEX TO GENTYP+
HIV 1 RNA Quant: 37 copies/mL — ABNORMAL HIGH (ref <20)
HIV-1 RNA Quant, Log: 1.57 {Log} — ABNORMAL HIGH (ref <1.30)

## 2013-01-17 LAB — T-HELPER CELL (CD4) - (RCID CLINIC ONLY): CD4 % Helper T Cell: 16 % — ABNORMAL LOW (ref 33–55)

## 2013-03-05 ENCOUNTER — Ambulatory Visit (INDEPENDENT_AMBULATORY_CARE_PROVIDER_SITE_OTHER): Payer: Medicare Other | Admitting: Internal Medicine

## 2013-03-05 ENCOUNTER — Encounter: Payer: Self-pay | Admitting: Internal Medicine

## 2013-03-05 VITALS — BP 166/91 | HR 92 | Temp 98.1°F | Ht 71.0 in | Wt 199.0 lb

## 2013-03-05 DIAGNOSIS — F17200 Nicotine dependence, unspecified, uncomplicated: Secondary | ICD-10-CM | POA: Diagnosis not present

## 2013-03-05 DIAGNOSIS — Z720 Tobacco use: Secondary | ICD-10-CM

## 2013-03-05 DIAGNOSIS — R21 Rash and other nonspecific skin eruption: Secondary | ICD-10-CM

## 2013-03-05 DIAGNOSIS — B20 Human immunodeficiency virus [HIV] disease: Secondary | ICD-10-CM | POA: Diagnosis not present

## 2013-03-05 DIAGNOSIS — I10 Essential (primary) hypertension: Secondary | ICD-10-CM | POA: Diagnosis not present

## 2013-03-05 MED ORDER — VARENICLINE TARTRATE 1 MG PO TABS: 1.0000 mg | ORAL_TABLET | Freq: Two times a day (BID) | ORAL | Status: DC

## 2013-03-05 MED ORDER — TRIAMCINOLONE ACETONIDE 0.1 % EX OINT: TOPICAL_OINTMENT | Freq: Two times a day (BID) | CUTANEOUS | Status: DC

## 2013-03-05 MED ORDER — VARENICLINE TARTRATE 0.5 MG X 11 & 1 MG X 42 PO MISC: ORAL | Status: DC

## 2013-03-05 NOTE — Assessment & Plan Note (Signed)
We will help facilitate an appointment with his primary doctor. He has not been taking his medications.

## 2013-03-05 NOTE — Progress Notes (Signed)
  Subjective:    Patient ID: Chad Moses, male    DOB: 1960/11/24, 52 y.o.   MRN: [id number]  HPI He comes in for followup of his HIV. He has been on tivicay and Truvada which was new for him in July of this year after being on Isentress and Truvada. Unfortunately, at that time he was taking his medication every other day because he had been out of refills and did not call. He then had notable detectable virus up to 798 copies. I then changed him to the once a day regimen and has been taking it since. He had followup labs which showed a viral load down to 37 copies and his CD4 count remained stable over 500. He denies any missed doses since starting the new regimen.  He has been using triamcinolone for his groin rash. He prefers the appointment. He has not seen his primary physician. He continues to struggle with smoking cessation. He is interested in Chantix.   Review of Systems  Constitutional: Negative for fatigue.  HENT: Negative for sore throat and trouble swallowing.   Eyes: Negative for visual disturbance.  Respiratory: Negative for cough and shortness of breath.   Cardiovascular: Negative for chest pain.  Gastrointestinal: Negative for nausea and diarrhea.  Musculoskeletal: Negative for myalgias and arthralgias.  Skin: Positive for rash.  Neurological: Negative for dizziness and headaches.  Hematological: Negative for adenopathy.  Psychiatric/Behavioral: Negative for dysphoric mood.       Objective:   Physical Exam  Constitutional: He is oriented to person, place, and time. He appears well-developed and well-nourished. No distress.  HENT:  Mouth/Throat: No oropharyngeal exudate.  Eyes: No scleral icterus.  Cardiovascular: Normal rate, regular rhythm and normal heart sounds.   No murmur heard. Pulmonary/Chest: Effort normal and breath sounds normal. No respiratory distress.  Lymphadenopathy:    He has no cervical adenopathy.  Neurological: He is alert and oriented to  person, place, and time.  Psychiatric: He has a normal mood and affect. His behavior is normal.          Assessment & Plan:

## 2013-03-05 NOTE — Assessment & Plan Note (Signed)
He has a mild groin rash consistent with Candida

## 2013-03-05 NOTE — Assessment & Plan Note (Signed)
He is interested in putting smoking. We discussed different options and he is interested in Chantix. This will be prescribed today. As reminded him this will greatly help with his blood pressure control.

## 2013-03-05 NOTE — Assessment & Plan Note (Signed)
He is doing well and I will recheck his labs today to assure continued suppression of his virus. He will return in 3 months if he  Has reassuring labs.

## 2013-03-06 LAB — HIV-1 RNA QUANT-NO REFLEX-BLD
HIV 1 RNA Quant: <20 copies/mL (ref <20)
HIV-1 RNA Quant, Log: <1.3 {Log} (ref <1.30)

## 2013-06-04 ENCOUNTER — Other Ambulatory Visit: Payer: Self-pay | Admitting: Internal Medicine

## 2013-06-04 ENCOUNTER — Other Ambulatory Visit: Payer: Medicare Other

## 2013-06-04 DIAGNOSIS — B20 Human immunodeficiency virus [HIV] disease: Secondary | ICD-10-CM | POA: Diagnosis not present

## 2013-06-04 LAB — COMPREHENSIVE METABOLIC PANEL
AST: 16 U/L (ref 0–37)
Albumin: 4.1 g/dL (ref 3.5–5.2)
Alkaline Phosphatase: 92 U/L (ref 39–117)
BUN: 14 mg/dL (ref 6–23)
Creat: 1.57 mg/dL — ABNORMAL HIGH (ref 0.50–1.35)
Glucose, Bld: 114 mg/dL — ABNORMAL HIGH (ref 70–99)
Potassium: 3.9 mEq/L (ref 3.5–5.3)
Total Bilirubin: 0.4 mg/dL (ref 0.3–1.2)

## 2013-06-04 LAB — CBC WITH DIFFERENTIAL/PLATELET
Basophils Relative: 0 % (ref 0–1)
Eosinophils Absolute: 0.2 10*3/uL (ref 0.0–0.7)
HCT: 41.6 % (ref 39.0–52.0)
Hemoglobin: 14.7 g/dL (ref 13.0–17.0)
Lymphocytes Relative: 19 % (ref 12–46)
MCH: 28.3 pg (ref 26.0–34.0)
MCHC: 35.3 g/dL (ref 30.0–36.0)
Monocytes Absolute: 0.4 10*3/uL (ref 0.1–1.0)
Monocytes Relative: 6 % (ref 3–12)

## 2013-06-05 LAB — HIV-1 RNA QUANT-NO REFLEX-BLD
HIV 1 RNA Quant: <20 copies/mL (ref <20)
HIV-1 RNA Quant, Log: <1.3 {Log} (ref <1.30)

## 2013-06-11 ENCOUNTER — Encounter: Payer: Self-pay | Admitting: Internal Medicine

## 2013-06-11 ENCOUNTER — Ambulatory Visit (INDEPENDENT_AMBULATORY_CARE_PROVIDER_SITE_OTHER): Payer: Medicare Other | Admitting: Internal Medicine

## 2013-06-11 VITALS — BP 168/104 | HR 97 | Temp 97.8°F | Ht 71.0 in | Wt 214.0 lb

## 2013-06-11 DIAGNOSIS — F17200 Nicotine dependence, unspecified, uncomplicated: Secondary | ICD-10-CM | POA: Diagnosis not present

## 2013-06-11 DIAGNOSIS — N182 Chronic kidney disease, stage 2 (mild): Secondary | ICD-10-CM | POA: Diagnosis not present

## 2013-06-11 DIAGNOSIS — B20 Human immunodeficiency virus [HIV] disease: Secondary | ICD-10-CM | POA: Diagnosis not present

## 2013-06-11 DIAGNOSIS — I10 Essential (primary) hypertension: Secondary | ICD-10-CM

## 2013-06-11 DIAGNOSIS — R21 Rash and other nonspecific skin eruption: Secondary | ICD-10-CM

## 2013-06-11 DIAGNOSIS — Z720 Tobacco use: Secondary | ICD-10-CM

## 2013-06-11 DIAGNOSIS — N2889 Other specified disorders of kidney and ureter: Secondary | ICD-10-CM

## 2013-06-11 MED ORDER — ENALAPRIL MALEATE 10 MG PO TABS: 10.0000 mg | ORAL_TABLET | Freq: Every day | ORAL | Status: DC

## 2013-06-11 MED ORDER — TRIAMCINOLONE ACETONIDE 0.1 % EX OINT: TOPICAL_OINTMENT | Freq: Two times a day (BID) | CUTANEOUS | Status: DC

## 2013-06-11 MED ORDER — HYDROCHLOROTHIAZIDE 25 MG PO TABS: 25.0000 mg | ORAL_TABLET | Freq: Every day | ORAL | Status: DC

## 2013-06-11 MED ORDER — ASPIRIN 81 MG PO TABS: 81.0000 mg | ORAL_TABLET | Freq: Every day | ORAL | Status: AC

## 2013-06-11 MED ORDER — ATENOLOL 100 MG PO TABS
100.0000 mg | ORAL_TABLET | Freq: Every day | ORAL | Status: DC
Start: 2013-06-11 — End: 2013-12-25

## 2013-06-11 NOTE — Assessment & Plan Note (Signed)
Refilled meds.  His creat is relatively stable so will refill enalapril as well. He is going to folllow up with his PCP, phone number provided.

## 2013-06-11 NOTE — Assessment & Plan Note (Signed)
Counseled extensively with smoking related to BP, renal disease.  3 minutes spent.

## 2013-06-11 NOTE — Progress Notes (Signed)
  Subjective:    Patient ID: Chad Moses, male    DOB: 10/16/1960, 53 y.o.   MRN: [id number]  HPI  He comes in for followup of his HIV. He has been on tivicay and Truvada which was new for him in July of this year after being on Isentress and Truvada. Unfortunately, at that time he was taking his medication every other day because he had been out of refills and did not call. He then had notable detectable virus up to 798 copies. I then changed him to the once a day regimen and has been taking it since. His labs prior to this visit show an undetectable viral load and CD 4 up to 370.  He denies any missed doses.  He has noted since starting Tivicay that he has had issues with rash on his groin area.  I previously gave him antifungal ointment but he has had no resolution.  He also has mild renal insufficiency that is stable.  I gave him Chantix last visit and he has greatly decreased his smoking but still smokes about 1-2 packs per week.  He has not been to his PCP and has been off of his BP meds.      Review of Systems  Constitutional: Negative for fatigue.  HENT: Negative for sore throat and trouble swallowing.   Eyes: Negative for visual disturbance.  Respiratory: Negative for cough and shortness of breath.   Cardiovascular: Negative for chest pain.  Gastrointestinal: Negative for nausea and diarrhea.  Musculoskeletal: Negative for arthralgias and myalgias.  Skin: Positive for rash.  Neurological: Negative for dizziness and headaches.  Hematological: Negative for adenopathy.  Psychiatric/Behavioral: Negative for dysphoric mood.       Objective:   Physical Exam  Constitutional: He is oriented to person, place, and time. He appears well-developed and well-nourished. No distress.  HENT:  Mouth/Throat: No oropharyngeal exudate.  Eyes: No scleral icterus.  Cardiovascular: Normal rate, regular rhythm and normal heart sounds.   No murmur heard. Pulmonary/Chest: Effort normal and breath  sounds normal. No respiratory distress.  Genitourinary: Penis normal.  Lymphadenopathy:    He has no cervical adenopathy.  Neurological: He is alert and oriented to person, place, and time.  Skin:  Groin area with hyperpigmented area, dry, scaley  Psychiatric: He has a normal mood and affect. His behavior is normal.          Assessment & Plan:

## 2013-06-11 NOTE — Assessment & Plan Note (Signed)
His exam looks more like eczema.  I will try topical steroids again and gave him instructions on skin hygeine, Eucerin lotion.

## 2013-06-11 NOTE — Patient Instructions (Signed)
Use Eucerin lotion after bathing Use the topical steroid 2 times per day for 7 days, repeat as needed Restart your blood pressure medications Call Dr. Nehemiah SettlePolite asap!

## 2013-06-11 NOTE — Assessment & Plan Note (Addendum)
He is doing well and will continue with the same.  RTC in 3 months.  I will check HLA for possible change to [facility].   Will check hep C Ab for routine screening. Has been hep B vaccinated, Hep A immune

## 2013-06-11 NOTE — Assessment & Plan Note (Signed)
Stable but some mild elevation from previous.  Likely HTN related.  I will consider change to abacavir based regimen if persistent.  I did refill his BP meds since he has not been back to his PCP and I encouraged him to return to his PCP for better BP and renal management.  Recheck GFR next visit.

## 2013-09-02 ENCOUNTER — Other Ambulatory Visit: Payer: Medicare Other

## 2013-09-17 ENCOUNTER — Ambulatory Visit (INDEPENDENT_AMBULATORY_CARE_PROVIDER_SITE_OTHER): Payer: Medicare Other | Admitting: Internal Medicine

## 2013-09-17 ENCOUNTER — Encounter: Payer: Self-pay | Admitting: Internal Medicine

## 2013-09-17 ENCOUNTER — Other Ambulatory Visit (HOSPITAL_COMMUNITY)
Admission: RE | Admit: 2013-09-17 | Discharge: 2013-09-17 | Disposition: A | Discharge status: Home / Self Care | Payer: Medicare Other | Source: Ambulatory Visit | Attending: Internal Medicine | Admitting: Internal Medicine

## 2013-09-17 VITALS — BP 137/88 | HR 84 | Temp 97.9°F | Ht 71.0 in | Wt 205.0 lb

## 2013-09-17 DIAGNOSIS — F17200 Nicotine dependence, unspecified, uncomplicated: Secondary | ICD-10-CM

## 2013-09-17 DIAGNOSIS — Z79899 Other long term (current) drug therapy: Secondary | ICD-10-CM | POA: Diagnosis not present

## 2013-09-17 DIAGNOSIS — M109 Gout, unspecified: Secondary | ICD-10-CM | POA: Diagnosis not present

## 2013-09-17 DIAGNOSIS — Z720 Tobacco use: Secondary | ICD-10-CM

## 2013-09-17 DIAGNOSIS — B20 Human immunodeficiency virus [HIV] disease: Secondary | ICD-10-CM | POA: Diagnosis not present

## 2013-09-17 DIAGNOSIS — Z113 Encounter for screening for infections with a predominantly sexual mode of transmission: Secondary | ICD-10-CM | POA: Insufficient documentation

## 2013-09-17 LAB — COMPLETE METABOLIC PANEL WITH GFR
ALT: 26 U/L (ref 0–53)
AST: 21 U/L (ref 0–37)
Albumin: 4.1 g/dL (ref 3.5–5.2)
Alkaline Phosphatase: 104 U/L (ref 39–117)
BUN: 15 mg/dL (ref 6–23)
CALCIUM: 9.8 mg/dL (ref 8.4–10.5)
CHLORIDE: 105 meq/L (ref 96–112)
CO2: 27 mEq/L (ref 19–32)
Creat: 1.64 mg/dL — ABNORMAL HIGH (ref 0.50–1.35)
GFR, EST AFRICAN AMERICAN: 55 mL/min — AB
GFR, Est Non African American: 48 mL/min — ABNORMAL LOW
Glucose, Bld: 108 mg/dL — ABNORMAL HIGH (ref 70–99)
Potassium: 4.1 mEq/L (ref 3.5–5.3)
Sodium: 141 mEq/L (ref 135–145)
Total Bilirubin: 0.7 mg/dL (ref 0.2–1.2)
Total Protein: 7.6 g/dL (ref 6.0–8.3)

## 2013-09-17 LAB — LIPID PANEL
CHOL/HDL RATIO: 7.2 ratio
CHOLESTEROL: 208 mg/dL — AB (ref 0–200)
HDL: 29 mg/dL — ABNORMAL LOW (ref >39)
LDL Cholesterol: 159 mg/dL — ABNORMAL HIGH (ref 0–99)
Triglycerides: 102 mg/dL (ref <150)
VLDL: 20 mg/dL (ref 0–40)

## 2013-09-17 LAB — CBC WITH DIFFERENTIAL/PLATELET
Basophils Absolute: 0.1 10*3/uL (ref 0.0–0.1)
Basophils Relative: 1 % (ref 0–1)
Eosinophils Absolute: 0.2 10*3/uL (ref 0.0–0.7)
Eosinophils Relative: 3 % (ref 0–5)
HCT: 42.9 % (ref 39.0–52.0)
Hemoglobin: 15.4 g/dL (ref 13.0–17.0)
LYMPHS ABS: 2.2 10*3/uL (ref 0.7–4.0)
LYMPHS PCT: 41 % (ref 12–46)
MCH: 29.8 pg (ref 26.0–34.0)
MCHC: 35.9 g/dL (ref 30.0–36.0)
MCV: 83 fL (ref 78.0–100.0)
Monocytes Absolute: 0.4 10*3/uL (ref 0.1–1.0)
Monocytes Relative: 7 % (ref 3–12)
NEUTROS PCT: 48 % (ref 43–77)
Neutro Abs: 2.6 10*3/uL (ref 1.7–7.7)
PLATELETS: 244 10*3/uL (ref 150–400)
RBC: 5.17 MIL/uL (ref 4.22–5.81)
RDW: 16.1 % — ABNORMAL HIGH (ref 11.5–15.5)
WBC: 5.4 10*3/uL (ref 4.0–10.5)

## 2013-09-17 LAB — RPR

## 2013-09-17 MED ORDER — COLCHICINE 0.6 MG PO TABS: 0.6000 mg | ORAL_TABLET | Freq: Every day | ORAL | Status: DC

## 2013-09-17 NOTE — Assessment & Plan Note (Addendum)
Counseled on cessation for 3 minutes.  Patient motivated and discussed patches.

## 2013-09-17 NOTE — Progress Notes (Signed)
  Subjective:    Patient ID: Chad Moses, male    DOB: 05/12/1961, 53 y.o.   MRN: [id number]  HPI  He comes in for followup of his HIV. He has been on tivicay and Truvada which was new for him in July of this year after being on Isentress and Truvada. Unfortunately, at that time he was taking his medication every other day because he had been out of refills and did not call. He then had notable detectable virus up to 798 copies. I then changed him to the once a day regimen and has been taking it since. No labs prior to this visit.  He denies any missed doses. Continues to struggle with smoking cessation though motivated to quit.  Has not seen his PCP since he owes money.      Review of Systems  Constitutional: Negative for fatigue.  HENT: Negative for sore throat and trouble swallowing.   Eyes: Negative for visual disturbance.  Respiratory: Negative for cough and shortness of breath.   Cardiovascular: Negative for chest pain.  Gastrointestinal: Negative for nausea and diarrhea.  Musculoskeletal: Negative for arthralgias and myalgias.  Neurological: Negative for dizziness and headaches.  Hematological: Negative for adenopathy.  Psychiatric/Behavioral: Negative for dysphoric mood.       Objective:   Physical Exam  Constitutional: He appears well-developed and well-nourished. No distress.  HENT:  Mouth/Throat: No oropharyngeal exudate.  Eyes: No scleral icterus.  Cardiovascular: Normal rate, regular rhythm and normal heart sounds.   No murmur heard. Pulmonary/Chest: Effort normal and breath sounds normal. No respiratory distress.  Genitourinary: Penis normal.  Lymphadenopathy:    He has no cervical adenopathy.  Psychiatric: He has a normal mood and affect. His behavior is normal.          Assessment & Plan:

## 2013-09-17 NOTE — Assessment & Plan Note (Signed)
Will need to check labs today.  RTC 4 months if ok.

## 2013-09-18 LAB — T-HELPER CELL (CD4) - (RCID CLINIC ONLY)
CD4 % Helper T Cell: 19 % — ABNORMAL LOW (ref 33–55)
CD4 T Cell Abs: 410 /uL (ref 400–2700)

## 2013-09-18 LAB — HIV-1 RNA QUANT-NO REFLEX-BLD
HIV 1 RNA Quant: 91 copies/mL — ABNORMAL HIGH (ref <20)
HIV-1 RNA Quant, Log: 1.96 {Log} — ABNORMAL HIGH (ref <1.30)

## 2013-09-19 LAB — URINE CYTOLOGY ANCILLARY ONLY
Chlamydia: NEGATIVE
Neisseria Gonorrhea: NEGATIVE

## 2013-09-23 LAB — HLA B*5701: HLA-B*5701 w/rflx HLA-B High: NEGATIVE

## 2013-10-02 ENCOUNTER — Other Ambulatory Visit: Payer: Self-pay | Admitting: Internal Medicine

## 2013-10-02 DIAGNOSIS — I10 Essential (primary) hypertension: Secondary | ICD-10-CM

## 2013-12-13 ENCOUNTER — Encounter (HOSPITAL_COMMUNITY): Payer: Self-pay | Admitting: Emergency Medicine

## 2013-12-13 ENCOUNTER — Emergency Department (INDEPENDENT_AMBULATORY_CARE_PROVIDER_SITE_OTHER)
Admission: EM | Admit: 2013-12-13 | Discharge: 2013-12-13 | Disposition: A | Discharge status: Home / Self Care | Payer: Medicare Other | Source: Home / Self Care

## 2013-12-13 DIAGNOSIS — M79609 Pain in unspecified limb: Secondary | ICD-10-CM

## 2013-12-13 DIAGNOSIS — M79672 Pain in left foot: Secondary | ICD-10-CM

## 2013-12-13 DIAGNOSIS — M109 Gout, unspecified: Secondary | ICD-10-CM

## 2013-12-13 DIAGNOSIS — M7660 Achilles tendinitis, unspecified leg: Secondary | ICD-10-CM | POA: Diagnosis not present

## 2013-12-13 DIAGNOSIS — M7662 Achilles tendinitis, left leg: Secondary | ICD-10-CM

## 2013-12-13 HISTORY — DX: Essential (primary) hypertension: I10

## 2013-12-13 HISTORY — DX: Gout, unspecified: M10.9

## 2013-12-13 HISTORY — DX: Human immunodeficiency virus (HIV) disease: B20

## 2013-12-13 LAB — URIC ACID: URIC ACID, SERUM: 10.6 mg/dL — AB (ref 4.0–7.8)

## 2013-12-13 MED ORDER — COLCHICINE 0.6 MG PO TABS: ORAL_TABLET | ORAL | Status: DC

## 2013-12-13 MED ORDER — TRAMADOL HCL 50 MG PO TABS: 50.0000 mg | ORAL_TABLET | Freq: Four times a day (QID) | ORAL | Status: DC | PRN

## 2013-12-13 MED ORDER — INDOMETHACIN 50 MG PO CAPS: 50.0000 mg | ORAL_CAPSULE | Freq: Two times a day (BID) | ORAL | Status: DC

## 2013-12-13 NOTE — ED Notes (Addendum)
Left foot pain for one month, red heel for 1 1/2 weeks.  Patient reports extreme pain running down the back of left foot and including left heel.  No known injury.  Pedal pulse 2 plus, brisk cap refill

## 2013-12-13 NOTE — ED Notes (Signed)
Patient not ready for discharge.  Labs and task have been ordered

## 2013-12-13 NOTE — Discharge Instructions (Signed)
Achilles Tendinitis Achilles tendinitis is inflammation of the tough, cord-like band that attaches the lower muscles of your leg to your heel (Achilles tendon). It is usually caused by overusing the tendon and joint involved.  CAUSES Achilles tendinitis can happen because of:  A sudden increase in exercise or activity (such as running).  Doing the same exercises or activities (such as jumping) over and over.  Not warming up calf muscles before exercising.  Exercising in shoes that are worn out or not made for exercise.  Having arthritis or a bone growth on the back of the heel bone. This can rub against the tendon and hurt the tendon. SIGNS AND SYMPTOMS The most common symptoms are:  Pain in the back of the leg, just above the heel. The pain usually gets worse with exercise and better with rest.  Stiffness or soreness in the back of the leg, especially in the morning.  Swelling of the skin over the Achilles tendon.  Trouble standing on tiptoe. Sometimes, an Achilles tendon tears (ruptures). Symptoms of an Achilles tendon rupture can include:  Sudden, severe pain in the back of the leg.  Trouble putting weight on the foot or walking normally. DIAGNOSIS Achilles tendinitis will be diagnosed based on symptoms and a physical examination. An X-ray may be done to check if another condition is causing your symptoms. An MRI may be ordered if your health care provider suspects you may have completely torn your tendon, which is called an Achilles tendon rupture.  TREATMENT  Achilles tendinitis usually gets better over time. It can take weeks to months to heal completely. Treatment focuses on treating the symptoms and helping the injury heal. HOME CARE INSTRUCTIONS   Rest your Achilles tendon and avoid activities that cause pain.  Apply ice to the injured area:  Put ice in a plastic bag.  Place a towel between your skin and the bag.  Leave the ice on for 20 minutes, 2-3 times a  day  Try to avoid using the tendon (other than gentle range of motion) while the tendon is painful. Do not resume use until instructed by your health care provider. Then begin use gradually. Do not increase use to the point of pain. If pain does develop, decrease use and continue the above measures. Gradually increase activities that do not cause discomfort until you achieve normal use.  Do exercises to make your calf muscles stronger and more flexible. Your health care provider or physical therapist can recommend exercises for you to do.  Wrap your ankle with an elastic bandage or other wrap. This can help keep your tendon from moving too much. Your health care provider will show you how to wrap your ankle correctly.  Only take over-the-counter or prescription medicines for pain, discomfort, or fever as directed by your health care provider. SEEK MEDICAL CARE IF:   Your pain and swelling increase or pain is uncontrolled with medicines.  You develop new, unexplained symptoms or your symptoms get worse.  You are unable to move your toes or foot.  You develop warmth and swelling in your foot.  You have an unexplained temperature. MAKE SURE YOU:   Understand these instructions.  Will watch your condition.  Will get help right away if you are not doing well or get worse. Document Released: 03/02/2005 Document Revised: 03/13/2013 Document Reviewed: 01/02/2013 [facility] Patient Information 2015 [place], [place]. This information is not intended to replace advice given to you by your health care provider. Make sure you discuss  any questions you have with your health care provider.  Tendinitis Tendinitis is swelling and inflammation of the tendons. Tendons are band-like tissues that connect muscle to bone. Tendinitis commonly occurs in the:   Shoulders (rotator cuff).  Heels (Achilles tendon).  Elbows (triceps tendon). CAUSES Tendinitis is usually caused by overusing the tendon, muscles,  and joints involved. When the tissue surrounding a tendon (synovium) becomes inflamed, it is called tenosynovitis. Tendinitis commonly develops in people whose jobs require repetitive motions. SYMPTOMS  Pain.  Tenderness.  Mild swelling. DIAGNOSIS Tendinitis is usually diagnosed by physical exam. Your health care provider may also order X-rays or other imaging tests. TREATMENT Your health care provider may recommend certain medicines or exercises for your treatment. HOME CARE INSTRUCTIONS   Use a sling or splint for as long as directed by your health care provider until the pain decreases.  Put ice on the injured area.  Put ice in a plastic bag.  Place a towel between your skin and the bag.  Leave the ice on for 15-20 minutes, 3-4 times a day, or as directed by your health care provider.  Avoid using the limb while the tendon is painful. Perform gentle range of motion exercises only as directed by your health care provider. Stop exercises if pain or discomfort increase, unless directed otherwise by your health care provider.  Only take over-the-counter or prescription medicines for pain, discomfort, or fever as directed by your health care provider. SEEK MEDICAL CARE IF:   Your pain and swelling increase.  You develop new, unexplained symptoms, especially increased numbness in the hands. MAKE SURE YOU:   Understand these instructions.  Will watch your condition.  Will get help right away if you are not doing well or get worse. Document Released: 05/20/2000 Document Revised: 05/28/2013 Document Reviewed: 08/09/2010 [facility] Patient Information 2015 [place]. This information is not intended to replace advice given to you by your health care provider. Make sure you discuss any questions you have with your health care provider.

## 2013-12-13 NOTE — ED Provider Notes (Signed)
CSN: [id number]     Arrival date & time 12/13/13  0908 History   First MD Initiated Contact with Patient 12/13/13 0935     Chief Complaint  Patient presents with  . Foot Pain   (Consider location/radiation/quality/duration/timing/severity/associated sxs/prior Treatment) HPI Comments: L foot pain for 10 or more days. No trauma. He does walk a lot. Swelling posterior foot and pain over achilles, post and medial ankle and plantar heel. Worse with wt bearing and plantar flexion. Hx gout.   Past Medical History  Diagnosis Date  . Hypertension   . HIV disease   . Gout    History reviewed. No pertinent past surgical history. No family history on file. History  Substance Use Topics  . Smoking status: Current Some Day Smoker -- 0.30 packs/day    Types: Cigarettes  . Smokeless tobacco: Never Used     Comment: given quitline info  . Alcohol Use: No    Review of Systems  Constitutional: Negative.   Gastrointestinal: Negative.   Musculoskeletal: Positive for arthralgias. Negative for neck pain and neck stiffness.       A s per hpi  Skin: Positive for color change.  Neurological: Negative.     Allergies  Review of patient's allergies indicates no known allergies.  Home Medications   Prior to Admission medications   Medication Sig Start Date End Date Taking? Authorizing Provider  aspirin 81 MG tablet Take 1 tablet (81 mg total) by mouth daily. 06/11/13   Gardiner Barefootobert W Comer, MD  atenolol (TENORMIN) 100 MG tablet Take 1 tablet (100 mg total) by mouth daily. 06/11/13   Gardiner Barefootobert W Comer, MD  colchicine 0.6 MG tablet 1 tab po now, repeat in 2 hours, then 1 q d 12/13/13   Hayden Rasmussenavid Joyce Leckey, NP  dolutegravir (TIVICAY) 50 MG tablet Take 1 tablet (50 mg total) by mouth daily. 01/01/13   Gardiner Barefootobert W Comer, MD  emtricitabine-tenofovir (TRUVADA) 200-300 MG per tablet Take 1 tablet by mouth daily. 01/01/13   Gardiner Barefootobert W Comer, MD  enalapril (VASOTEC) 10 MG tablet TAKE 1 TABLET BY MOUTH DAILY.    Gardiner Barefootobert W Comer, MD   hydrochlorothiazide (HYDRODIURIL) 25 MG tablet TAKE 1 TABLET BY MOUTH DAILY.    Gardiner Barefootobert W Comer, MD  indomethacin (INDOCIN) 50 MG capsule Take 1 capsule (50 mg total) by mouth 2 (two) times daily with a meal. 12/13/13   Hayden Rasmussenavid Corie Vavra, NP  traMADol (ULTRAM) 50 MG tablet Take 1 tablet (50 mg total) by mouth every 6 (six) hours as needed. 12/13/13   Hayden Rasmussenavid Priyal Musquiz, NP  triamcinolone ointment (KENALOG) 0.1 % Apply topically 2 (two) times daily. 06/11/13   Gardiner Barefootobert W Comer, MD  varenicline (CHANTIX PAK) 0.5 MG X 11 & 1 MG X 42 tablet 0.5 mg tablet by mouth once daily for 3 days, then 0.5 mg tablet twice daily for 4 days, then 1 mg tablet twice daily. 03/05/13   Gardiner Barefootobert W Comer, MD  varenicline (CHANTIX) 1 MG tablet Take 1 tablet (1 mg total) by mouth 2 (two) times daily. 03/05/13   Gardiner Barefootobert W Comer, MD   BP 133/85  Pulse 81  Temp(Src) 97.8 F (36.6 C) (Oral)  Resp 16  SpO2 96% Physical Exam  Nursing note and vitals reviewed. Constitutional: He is oriented to person, place, and time. He appears well-developed and well-nourished. No distress.  Pulmonary/Chest: Effort normal. No respiratory distress.  Musculoskeletal:  Tenderness to light touch to medial, posterior and portion of anterior ankle and achilles tendon. Mild redness between  the malleolus and the plantar margin.  Neurological: He is alert and oriented to person, place, and time.  Skin: Skin is warm and dry.  Psychiatric: He has a normal mood and affect.    ED Course  Procedures (including critical care time) Labs Review Labs Reviewed  URIC ACID    Imaging Review No results found.   MDM   1. Foot pain, left   2. Acute gout of left ankle, unspecified cause   3. Left Achilles tendinitis    Uric acid ASO  trmadol Indocin colchicine    Hayden Rasmussen, NP 12/13/13 1025

## 2013-12-13 NOTE — ED Provider Notes (Signed)
Medical screening examination/treatment/procedure(s) were performed by resident physician or non-physician practitioner and as supervising physician I was immediately available for consultation/collaboration.   Barkley BrunsKINDL,Izaiha Lo DOUGLAS MD.   Linna HoffJames D Shakiya Mcneary, MD 12/13/13 [phone]

## 2013-12-18 ENCOUNTER — Telehealth (HOSPITAL_COMMUNITY): Payer: Self-pay | Admitting: *Deleted

## 2013-12-21 NOTE — ED Notes (Signed)
I called pt.  Pt. verified x 2 and given result.  Pt. told he was adequately treated with the Colchicine and Indocin and needs to f/u with his PCP.  He said he has an appointment on 8/27.   He said he is still hurting but not as bad.  He  finished the Colchicine and has 3 Indocin left. I instructed him to call his PCP on Monday and explain this and ask if they can work him in this week. I told him he might benefit from a medication to lower his uric acid, so he does not keep getting flare ups of gout.  Pt. Voiced understanding. Vassie MoselleYork, Ajee Heasley M 12/21/2013

## 2013-12-25 ENCOUNTER — Other Ambulatory Visit: Payer: Self-pay | Admitting: Internal Medicine

## 2013-12-25 DIAGNOSIS — B20 Human immunodeficiency virus [HIV] disease: Secondary | ICD-10-CM

## 2013-12-25 DIAGNOSIS — I10 Essential (primary) hypertension: Secondary | ICD-10-CM

## 2014-01-21 ENCOUNTER — Ambulatory Visit: Payer: Medicare Other | Admitting: Internal Medicine

## 2014-01-30 ENCOUNTER — Ambulatory Visit (INDEPENDENT_AMBULATORY_CARE_PROVIDER_SITE_OTHER): Payer: Medicare Other | Admitting: Internal Medicine

## 2014-01-30 ENCOUNTER — Encounter: Payer: Self-pay | Admitting: Internal Medicine

## 2014-01-30 VITALS — BP 133/84 | HR 68 | Temp 97.8°F | Ht 71.0 in | Wt 201.0 lb

## 2014-01-30 DIAGNOSIS — M109 Gout, unspecified: Secondary | ICD-10-CM

## 2014-01-30 DIAGNOSIS — N182 Chronic kidney disease, stage 2 (mild): Secondary | ICD-10-CM | POA: Diagnosis not present

## 2014-01-30 DIAGNOSIS — B20 Human immunodeficiency virus [HIV] disease: Secondary | ICD-10-CM | POA: Diagnosis not present

## 2014-01-30 LAB — COMPLETE METABOLIC PANEL WITH GFR
ALBUMIN: 4.5 g/dL (ref 3.5–5.2)
ALT: 40 U/L (ref 0–53)
AST: 26 U/L (ref 0–37)
Alkaline Phosphatase: 116 U/L (ref 39–117)
BUN: 15 mg/dL (ref 6–23)
CALCIUM: 9.6 mg/dL (ref 8.4–10.5)
CHLORIDE: 104 meq/L (ref 96–112)
CO2: 26 mEq/L (ref 19–32)
Creat: 1.77 mg/dL — ABNORMAL HIGH (ref 0.50–1.35)
GFR, Est African American: 50 mL/min — ABNORMAL LOW
GFR, Est Non African American: 44 mL/min — ABNORMAL LOW
Glucose, Bld: 92 mg/dL (ref 70–99)
POTASSIUM: 4.4 meq/L (ref 3.5–5.3)
SODIUM: 138 meq/L (ref 135–145)
Total Bilirubin: 0.4 mg/dL (ref 0.2–1.2)
Total Protein: 8 g/dL (ref 6.0–8.3)

## 2014-01-30 MED ORDER — COLCHICINE 0.6 MG PO TABS: 0.6000 mg | ORAL_TABLET | Freq: Two times a day (BID) | ORAL | Status: DC

## 2014-01-30 NOTE — Assessment & Plan Note (Signed)
Doing good by report. Labs today and rtc 4 months if no issues.

## 2014-01-30 NOTE — Progress Notes (Signed)
  Subjective:    Patient ID: Chad Moses, male    DOB: 05/31/1961, 53 y.o.   MRN: [id number]  HPI  He comes in for followup of his HIV. He has been on tivicay and Truvada which was new for him in July of this year after being on Isentress and Truvada. Unfortunately, at that time he was taking his medication every other day because he had been out of refills and did not call. He then had notable detectable virus up to 798 copies. I then changed him to the once a day regimen and has been taking it since. No labs prior to this visit.  He denies any missed doses. Continues to struggle with smoking cessation.  No current PCP.  Some swelling of heel he was told is gout.  Has taken colchicine in the past with relief.  Uric acid has been up.      Review of Systems  Constitutional: Negative for fatigue.  HENT: Negative for sore throat and trouble swallowing.   Eyes: Negative for visual disturbance.  Respiratory: Negative for cough and shortness of breath.   Cardiovascular: Negative for chest pain.  Gastrointestinal: Negative for nausea and diarrhea.  Musculoskeletal: Negative for arthralgias and myalgias.  Neurological: Negative for dizziness and headaches.  Hematological: Negative for adenopathy.  Psychiatric/Behavioral: Negative for dysphoric mood.       Objective:   Physical Exam  Constitutional: He appears well-developed and well-nourished. No distress.  HENT:  Mouth/Throat: No oropharyngeal exudate.  Eyes: No scleral icterus.  Cardiovascular: Normal rate, regular rhythm and normal heart sounds.   No murmur heard. Pulmonary/Chest: Effort normal and breath sounds normal. No respiratory distress.  Genitourinary: Penis normal.  Lymphadenopathy:    He has no cervical adenopathy.  Psychiatric: He has a normal mood and affect. His behavior is normal.          Assessment & Plan:

## 2014-01-30 NOTE — Assessment & Plan Note (Signed)
Not sure if true diagnosis based on location.  Will try colchicine and refer to PCP.

## 2014-01-30 NOTE — Assessment & Plan Note (Signed)
Will recheck labs today .

## 2014-01-31 LAB — HIV-1 RNA QUANT-NO REFLEX-BLD
HIV 1 RNA QUANT: 22 {copies}/mL — AB (ref <20)
HIV-1 RNA QUANT, LOG: 1.34 {Log} — AB (ref <1.30)

## 2014-01-31 LAB — T-HELPER CELL (CD4) - (RCID CLINIC ONLY)
CD4 % Helper T Cell: 20 % — ABNORMAL LOW (ref 33–55)
CD4 T Cell Abs: 400 /uL (ref 400–2700)

## 2014-02-03 ENCOUNTER — Telehealth: Payer: Self-pay | Admitting: General Practice

## 2014-02-03 NOTE — Telephone Encounter (Signed)
Left voicemail for patient to call to schedule an appointment to establish care.

## 2014-02-19 ENCOUNTER — Ambulatory Visit (INDEPENDENT_AMBULATORY_CARE_PROVIDER_SITE_OTHER): Payer: Medicare Other | Admitting: Family Medicine

## 2014-02-19 VITALS — BP 169/84 | HR 68 | Temp 98.2°F | Resp 18 | Ht 71.0 in | Wt 200.0 lb

## 2014-02-19 DIAGNOSIS — E785 Hyperlipidemia, unspecified: Secondary | ICD-10-CM

## 2014-02-19 DIAGNOSIS — M109 Gout, unspecified: Secondary | ICD-10-CM

## 2014-02-19 DIAGNOSIS — K029 Dental caries, unspecified: Secondary | ICD-10-CM

## 2014-02-19 DIAGNOSIS — I10 Essential (primary) hypertension: Secondary | ICD-10-CM

## 2014-02-19 DIAGNOSIS — Z23 Encounter for immunization: Secondary | ICD-10-CM | POA: Diagnosis not present

## 2014-02-19 DIAGNOSIS — F17200 Nicotine dependence, unspecified, uncomplicated: Secondary | ICD-10-CM | POA: Diagnosis not present

## 2014-02-19 DIAGNOSIS — H538 Other visual disturbances: Secondary | ICD-10-CM

## 2014-02-19 DIAGNOSIS — B20 Human immunodeficiency virus [HIV] disease: Secondary | ICD-10-CM

## 2014-02-19 DIAGNOSIS — Z720 Tobacco use: Secondary | ICD-10-CM

## 2014-02-19 NOTE — Progress Notes (Signed)
Subjective:    Patient ID: Chad Moses, male    DOB: Mar 04, 1961, 53 y.o.   MRN: [id number]  HPI Patient presents to establish care. He states that he has not had a primary provider for a number of years and has been receiving care at [facility]. He has a history of HIV that is currently stable. He is followed every 3 months by Dr. Luciana Axe. He reports that he takes medications consistently. He denies fatigue, fever, night sweats, nausea, vomiting or diarrhea.    Patient here for follow-up of elevated blood pressure. He is not exercising and is not adherent to low salt diet.  He reports that he does not check bp at home.  Patient denies chest pain, dyspnea, fatigue, irregular heart beat, orthopnea, palpitations and syncope.  Cardiovascular risk factors include  dyslipidemia, hypertension, sedentary lifestyle and smoking/ tobacco exposure.   Patient also here for an evaluation of gout. The patient reports monthly gouty attacks primarily in the right ankle.  Limitation on activities include walking and standing for extended periods of time during gouty attack. . The patient is not avoiding high purine foods and reports that he does not consume alcoholic beverages regularly.  Past Medical History  Diagnosis Date  . Hypertension   . HIV disease   . Gout     Review of Systems  Constitutional: Negative.  Negative for fatigue.  HENT: Negative.   Eyes: Positive for visual disturbance (left eye blurriness).  Respiratory: Negative.   Cardiovascular: Negative.  Negative for chest pain, palpitations and leg swelling.  Gastrointestinal: Negative.   Endocrine: Negative.  Negative for polydipsia, polyphagia and polyuria.  Musculoskeletal: Positive for joint swelling (periodic right ankle swelling).  Skin: Negative.   Allergic/Immunologic: Negative.   Neurological: Negative.   Hematological: Negative.   Psychiatric/Behavioral: Negative.  Negative for suicidal ideas and sleep  disturbance. The patient is not nervous/anxious.        Objective:   Physical Exam  Constitutional: He is oriented to person, place, and time. He appears well-developed and well-nourished.  HENT:  Head: Normocephalic and atraumatic.  Right Ear: External ear normal.  Mouth/Throat: Dental caries present.  Eyes: Conjunctivae and lids are normal. Pupils are equal, round, and reactive to light. Right eye exhibits exudate. Right eye exhibits no hordeolum. No foreign body present in the right eye. Left eye exhibits no hordeolum. No foreign body present in the left eye. Right conjunctiva is not injected. Right conjunctiva has no hemorrhage. Left conjunctiva is not injected. Left conjunctiva has no hemorrhage. No scleral icterus.    Neck: Normal range of motion. Neck supple.  Cardiovascular: Normal rate, regular rhythm and normal heart sounds.   Pulmonary/Chest: Effort normal and breath sounds normal.  Abdominal: Soft. Bowel sounds are normal.  Musculoskeletal: Normal range of motion.       Right ankle: He exhibits normal range of motion, no swelling and no ecchymosis. No tenderness.  Neurological: He is alert and oriented to person, place, and time. He has normal reflexes.  Skin: Skin is warm and dry.  Psychiatric: He has a normal mood and affect. His behavior is normal. Judgment and thought content normal.         BP 169/84  Pulse 68  Temp(Src) 98.2 F (36.8 C) (Oral)  Resp 18  Ht  (1.803 m)  Wt 200 lb (90.719 kg)  BMI 27.91 kg/m2 Assessment & Plan:   1. GOUT He maintains that gout primarily occurs in right ankle. He is currently  not experiencing a flare. Provided written material on low purine diet.  -Continue colchicine 0.6 mg daily as previously prescribed - Uric Acid; Future  2. HIV DISEASE Stable on current medication regimen. He states that he is taking medications consistently. He is to follow up with [facility] as scheduled.   3. Tobacco abuse He states that he smoke  0.5 ppd. He is not ready to quit. Smoking cessation instruction/counseling given:  counseled patient on the dangers of tobacco use, advised patient to stop smoking, and reviewed strategies to maximize success  4. HYPERTENSION BP elevated. Manual repeat 152/82. Recommend lowfat, low sodium diet divided over 6 small meals, increase water intake to 6-8 glasses per day, and exercise 3 days per week for 30 minutes.  - COMPLETE METABOLIC PANEL WITH GFR; Future - Lipid Panel; Future  5. HYPERLIPIDEMIA   Lipid Panel; Future   6. DENTAL CARIES -Recommend that he schedule a dental visit  7. Need for prophylactic vaccination and inoculation against influenza - Flu Vaccine QUAD 36+ mos PF IM (Fluarix Quad PF)  8. Need for Tdap vaccination  - Tdap vaccine greater than or equal to 7yo IM  9. Blurred vision, left eye - Ambulatory referral to Ophthalmology   RTC: 3 months for a complete physical examination with Dr. Wynona Canes, FNP

## 2014-02-19 NOTE — Patient Instructions (Signed)

## 2014-02-24 ENCOUNTER — Encounter: Payer: Self-pay | Admitting: Family Medicine

## 2014-02-24 MED ORDER — COLCHICINE 0.6 MG PO TABS: 0.6000 mg | ORAL_TABLET | Freq: Every day | ORAL | Status: DC

## 2014-02-27 ENCOUNTER — Other Ambulatory Visit: Payer: Self-pay | Admitting: Internal Medicine

## 2014-02-27 DIAGNOSIS — B20 Human immunodeficiency virus [HIV] disease: Secondary | ICD-10-CM

## 2014-02-28 ENCOUNTER — Other Ambulatory Visit: Payer: Self-pay | Admitting: Internal Medicine

## 2014-03-25 DIAGNOSIS — H40011 Open angle with borderline findings, low risk, right eye: Secondary | ICD-10-CM | POA: Diagnosis not present

## 2014-03-25 DIAGNOSIS — H524 Presbyopia: Secondary | ICD-10-CM | POA: Diagnosis not present

## 2014-03-25 DIAGNOSIS — H31002 Unspecified chorioretinal scars, left eye: Secondary | ICD-10-CM | POA: Diagnosis not present

## 2014-03-25 DIAGNOSIS — H2511 Age-related nuclear cataract, right eye: Secondary | ICD-10-CM | POA: Diagnosis not present

## 2014-03-27 ENCOUNTER — Other Ambulatory Visit: Payer: Self-pay | Admitting: Internal Medicine

## 2014-04-15 ENCOUNTER — Ambulatory Visit (HOSPITAL_COMMUNITY)
Admission: RE | Admit: 2014-04-15 | Discharge: 2014-04-15 | Disposition: A | Discharge status: Home / Self Care | Payer: Medicare Other | Source: Ambulatory Visit | Attending: Family Medicine | Admitting: Family Medicine

## 2014-04-15 ENCOUNTER — Ambulatory Visit (INDEPENDENT_AMBULATORY_CARE_PROVIDER_SITE_OTHER): Payer: Medicare Other | Admitting: Family Medicine

## 2014-04-15 VITALS — BP 151/73 | HR 89 | Temp 97.8°F | Resp 18 | Ht 71.0 in | Wt 210.0 lb

## 2014-04-15 DIAGNOSIS — M109 Gout, unspecified: Secondary | ICD-10-CM | POA: Insufficient documentation

## 2014-04-15 DIAGNOSIS — M79672 Pain in left foot: Secondary | ICD-10-CM

## 2014-04-15 DIAGNOSIS — M1A0720 Idiopathic chronic gout, left ankle and foot, without tophus (tophi): Secondary | ICD-10-CM

## 2014-04-15 DIAGNOSIS — I739 Peripheral vascular disease, unspecified: Secondary | ICD-10-CM | POA: Insufficient documentation

## 2014-04-15 DIAGNOSIS — B20 Human immunodeficiency virus [HIV] disease: Secondary | ICD-10-CM

## 2014-04-15 DIAGNOSIS — M19072 Primary osteoarthritis, left ankle and foot: Secondary | ICD-10-CM | POA: Diagnosis not present

## 2014-04-15 DIAGNOSIS — M10072 Idiopathic gout, left ankle and foot: Secondary | ICD-10-CM | POA: Diagnosis not present

## 2014-04-15 DIAGNOSIS — I10 Essential (primary) hypertension: Secondary | ICD-10-CM

## 2014-04-15 MED ORDER — COLCHICINE 0.6 MG PO TABS: 0.6000 mg | ORAL_TABLET | Freq: Every day | ORAL | Status: DC

## 2014-04-15 NOTE — Progress Notes (Signed)
Subjective:    Patient ID: Chad Moses, male    DOB: 07/04/1960, 53 y.o.   MRN: [id number]  HPI [name], a patient with a history of gout and HIV presents with left foot pain. He states that pain began to increase in intensity 1 day ago. He reports that he has not had a gout flare since last summer. Attacks occur primarily in the heel aspect of the left foot. Current pain intensity is 7/10 described as throbbing and intermittent.  He reports that he took Ibuprofen prior to arrival with minimal relief. He states that he has not been taking daily colchicine. He did not refill medication. The patient is not avoiding high purine foods and he denies drinking alcoholic beverages.  Past Medical History  Diagnosis Date  . Hypertension   . HIV disease   . Gout    Review of Systems  Constitutional: Negative.   HENT: Negative.   Eyes: Negative.   Respiratory: Negative.   Cardiovascular: Negative.   Gastrointestinal: Negative.   Genitourinary: Negative.   Musculoskeletal: Positive for myalgias (left heel pain) and joint swelling.  Skin: Negative.   Allergic/Immunologic: Negative.   Neurological: Negative.   Hematological: Negative.   Psychiatric/Behavioral: Negative.        Objective:   Physical Exam  Constitutional: He is oriented to person, place, and time. He appears well-developed and well-nourished.  HENT:  Head: Normocephalic and atraumatic.  Right Ear: External ear normal.  Left Ear: External ear normal.  Mouth/Throat: Oropharynx is clear and moist.  Eyes: Conjunctivae and EOM are normal.  Neck: Normal range of motion. Neck supple.  Pulmonary/Chest: Effort normal and breath sounds normal.  Abdominal: Soft. Bowel sounds are normal.  Musculoskeletal:       Left foot: There is decreased range of motion, tenderness and swelling (2 +, warm to palpation).  Neurological: He is alert and oriented to person, place, and time. He has normal reflexes.  Skin: Skin is warm  and dry.  Psychiatric: He has a normal mood and affect. His behavior is normal. Judgment and thought content normal.         BP 151/73 mmHg  Pulse 89  Temp(Src) 97.8 F (36.6 C) (Oral)  Resp 18  Ht 5\' 11"  (1.803 m)  Wt 210 lb (95.255 kg)  BMI 29.30 kg/m2 Assessment & Plan:  1. Idiopathic chronic gout of left foot without tophus Reviewed last labs. Previous uric acid level is 11.6. He has not been taking Colchicine daily. He reports that he has not taken since last summer. He was prescribed Indomethacin during last gout flare. I reviewed previous creatinine level which was mildly elevated. I will re-check creatinine level prior to prescribing a long term anti-uricosuric. Patient is currently on hydrochlorothiazide, which he is not taking consistently  for blood pressure, I will d/c hydrocholorothiazide  - Uric Acid - COMPLETE METABOLIC PANEL WITH GFR  colchicine 0.6 MG tablet; Take 1 tablet (0.6 mg total) by mouth daily. Take 1.2 mg initially, take 0.6 1 hour later  Dispense: 3 tablet; Refill: 0  2. Left foot pain Patient is currently using an air cast that was ordered by an orthopedic physician for his left foot some years ago. He reports that he is unable to apply pressure to his foot and hair cast improves ambulation.   Take Naproxen 500 mg twice daily for 5 days as needed. Follow up in 1 week. - DG Foot Complete Left; Future  3. Essential hypertension Elevated; repeat blood  pressure. Will discontinue hydrochlorothiazide due to hyperuricemia and increase Enlapril to 20 mg per day. Patient to return to clinic in 1 week for a blood pressure check .  - Urinalysis, Complete  4. Human immunodeficiency virus (HIV) disease Stable on current medications. He reports that he takes medications consistently and has a follow up appt scheduled with Dr. Luciana Axeomer.    RTC: 1 week for hypertension and left foot pain

## 2014-04-15 NOTE — Patient Instructions (Signed)
Gout °Gout is when your joints become red, sore, and swell (inflamed). This is caused by the buildup of uric acid crystals in the joints. Uric acid is a chemical that is normally in the blood. If the level of uric acid gets too high in the blood, these crystals form in your joints and tissues. Over time, these crystals can form into masses near the joints and tissues. These masses can destroy bone and cause the bone to look misshapen (deformed). °HOME CARE  °· Do not take aspirin for pain. °· Only take medicine as told by your doctor. °· Rest the joint as much as you can. When in bed, keep sheets and blankets off painful areas. °· Keep the sore joints raised (elevated). °· Put warm or cold packs on painful joints. Use of warm or cold packs depends on which works best for you. °· Use crutches if the painful joint is in your leg. °· Drink enough fluids to keep your pee (urine) clear or pale yellow. Limit alcohol, sugary drinks, and drinks with fructose in them. °· Follow your diet instructions. Pay careful attention to how much protein you eat. Include fruits, vegetables, whole grains, and fat-free or low-fat milk products in your daily diet. Talk to your doctor or dietitian about the use of coffee, vitamin C, and cherries. These may help lower uric acid levels. °· Keep a healthy body weight. °GET HELP RIGHT AWAY IF:  °· You have watery poop (diarrhea), throw up (vomit), or have any side effects from medicines. °· You do not feel better in 24 hours, or you are getting worse. °· Your joint becomes suddenly more tender, and you have chills or a fever. °MAKE SURE YOU:  °· Understand these instructions. °· Will watch your condition. °· Will get help right away if you are not doing well or get worse. °Document Released: 03/01/2008 Document Revised: 10/07/2013 Document Reviewed: 01/04/2012 °ExitCare® Patient Information ©2015 [employer]. This information is not intended to replace advice given to you by your health care  provider. Make sure you discuss any questions you have with your health care provider. ° °Low-Purine Diet °Purines are compounds that affect the level of uric acid in your body. A low-purine diet is a diet that is low in purines. Eating a low-purine diet can prevent the level of uric acid in your body from getting too high and causing gout or kidney stones or both. °WHAT DO I NEED TO KNOW ABOUT THIS DIET? °· Choose low-purine foods. Examples of low-purine foods are listed in the next section. °· Drink plenty of fluids, especially water. Fluids can help remove uric acid from your body. Try to drink 8-16 cups (1.9-3.8 L) a day. °· Limit foods high in fat, especially saturated fat, as fat makes it harder for the body to get rid of uric acid. Foods high in saturated fat include pizza, cheese, ice cream, whole milk, fried foods, and gravies. Choose foods that are lower in fat and lean sources of protein. Use olive oil when cooking as it contains healthy fats that are not high in saturated fat. °· Limit alcohol. Alcohol interferes with the elimination of uric acid from your body. If you are having a gout attack, avoid all alcohol. °· Keep in mind that different people's bodies react differently to different foods. You will probably learn over time which foods do or do not affect you. If you discover that a food tends to cause your gout to flare up, avoid eating that   food. You can more freely enjoy foods that do not cause problems. If you have any questions about a food item, talk to your dietitian or health care provider. °WHICH FOODS ARE LOW, MODERATE, AND HIGH IN PURINES? °The following is a list of foods that are low, moderate, and high in purines. You can eat any amount of the foods that are low in purines. You may be able to have small amounts of foods that are moderate in purines. Ask your health care provider how much of a food moderate in purines you can have. Avoid foods high in purines. °Grains °· Foods low in  purines: Enriched white bread, pasta, rice, cake, cornbread, popcorn. °· Foods moderate in purines: Whole-grain breads and cereals, wheat germ, bran, oatmeal. Uncooked oatmeal. Dry wheat bran or wheat germ. °· Foods high in purines: Pancakes, French toast, biscuits, muffins. °Vegetables °· Foods low in purines: All vegetables, except those that are moderate in purines. °· Foods moderate in purines: Asparagus, cauliflower, spinach, mushrooms, green peas. °Fruits °· All fruits are low in purines. °Meats and other Protein Foods °· Foods low in purines: Eggs, nuts, peanut butter. °· Foods moderate in purines: 80-90% lean beef, lamb, veal, pork, poultry, fish, eggs, peanut butter, nuts. Crab, lobster, oysters, and shrimp. Cooked dried beans, peas, and lentils. °· Foods high in purines: Anchovies, sardines, herring, mussels, tuna, codfish, scallops, trout, and haddock. Bacon. Organ meats (such as liver or kidney). Tripe. Game meat. Goose. Sweetbreads. °Dairy °· All dairy foods are low in purines. Low-fat and fat-free dairy products are best because they are low in saturated fat. °Beverages °· Drinks low in purines: Water, carbonated beverages, tea, coffee, cocoa. °· Drinks moderate in purines: Soft drinks and other drinks sweetened with high-fructose corn syrup. Juices. To find whether a food or drink is sweetened with high-fructose corn syrup, look at the ingredients list. °· Drinks high in purines: Alcoholic beverages (such as beer). °Condiments °· Foods low in purines: Salt, herbs, olives, pickles, relishes, vinegar. °· Foods moderate in purines: Butter, margarine, oils, mayonnaise. °Fats and Oils °· Foods low in purines: All types, except gravies and sauces made with meat. °· Foods high in purines: Gravies and sauces made with meat. °Other Foods °· Foods low in purines: Sugars, sweets, gelatin. Cake. Soups made without meat. °· Foods moderate in purines: Meat-based or fish-based soups, broths, or bouillons. Foods and  drinks sweetened with high-fructose corn syrup. °· Foods high in purines: High-fat desserts (such as ice cream, cookies, cakes, pies, doughnuts, and chocolate). °Contact your dietitian for more information on foods that are not listed here. °Document Released: 09/17/2010 Document Revised: 05/28/2013 Document Reviewed: 04/29/2013 °ExitCare® Patient Information ©2015 [employer]. This information is not intended to replace advice given to you by your health care provider. Make sure you discuss any questions you have with your health care provider. ° °

## 2014-04-16 ENCOUNTER — Telehealth: Payer: Self-pay | Admitting: Family Medicine

## 2014-04-16 ENCOUNTER — Encounter: Payer: Self-pay | Admitting: Family Medicine

## 2014-04-16 DIAGNOSIS — I739 Peripheral vascular disease, unspecified: Secondary | ICD-10-CM

## 2014-04-16 LAB — URINALYSIS, COMPLETE
Bacteria, UA: NONE SEEN
Bilirubin Urine: NEGATIVE
CASTS: NONE SEEN
Crystals: NONE SEEN
Glucose, UA: NEGATIVE mg/dL
Hgb urine dipstick: NEGATIVE
Ketones, ur: NEGATIVE mg/dL
Leukocytes, UA: NEGATIVE
Nitrite: NEGATIVE
PH: 5.5 (ref 5.0–8.0)
Protein, ur: NEGATIVE mg/dL
Specific Gravity, Urine: 1.013 (ref 1.005–1.030)
Squamous Epithelial / LPF: NONE SEEN
Urobilinogen, UA: 0.2 mg/dL (ref 0.0–1.0)

## 2014-04-16 LAB — COMPLETE METABOLIC PANEL WITH GFR
ALBUMIN: 4.3 g/dL (ref 3.5–5.2)
ALK PHOS: 93 U/L (ref 39–117)
ALT: 30 U/L (ref 0–53)
AST: 18 U/L (ref 0–37)
BUN: 17 mg/dL (ref 6–23)
CALCIUM: 9.6 mg/dL (ref 8.4–10.5)
CHLORIDE: 103 meq/L (ref 96–112)
CO2: 24 mEq/L (ref 19–32)
Creat: 1.56 mg/dL — ABNORMAL HIGH (ref 0.50–1.35)
GFR, Est African American: 59 mL/min — ABNORMAL LOW
GFR, Est Non African American: 51 mL/min — ABNORMAL LOW
GLUCOSE: 111 mg/dL — AB (ref 70–99)
POTASSIUM: 4 meq/L (ref 3.5–5.3)
Sodium: 138 mEq/L (ref 135–145)
Total Bilirubin: 0.4 mg/dL (ref 0.2–1.2)
Total Protein: 7.4 g/dL (ref 6.0–8.3)

## 2014-04-16 LAB — URIC ACID: Uric Acid, Serum: 9.7 mg/dL — ABNORMAL HIGH (ref 4.0–7.8)

## 2014-04-16 MED ORDER — NAPROXEN 500 MG PO TABS: 500.0000 mg | ORAL_TABLET | Freq: Two times a day (BID) | ORAL | Status: AC

## 2014-04-16 MED ORDER — ENALAPRIL MALEATE 20 MG PO TABS: ORAL_TABLET | ORAL | Status: DC

## 2014-04-16 NOTE — Telephone Encounter (Signed)
Notified patient to discuss x-ray at length. [name] is to follow up in office on 04/21/2014 at 1:15 for hypertension and left foot pain. He states that pain intensity has decreased to 7/10. He states that he is taking medications as prescribed.

## 2014-04-21 ENCOUNTER — Encounter: Payer: Self-pay | Admitting: Family Medicine

## 2014-04-21 ENCOUNTER — Ambulatory Visit (INDEPENDENT_AMBULATORY_CARE_PROVIDER_SITE_OTHER): Payer: Medicare Other | Admitting: Family Medicine

## 2014-04-21 VITALS — BP 150/78 | HR 70 | Temp 97.8°F | Resp 18 | Ht 71.0 in | Wt 211.0 lb

## 2014-04-21 DIAGNOSIS — B20 Human immunodeficiency virus [HIV] disease: Secondary | ICD-10-CM | POA: Diagnosis not present

## 2014-04-21 DIAGNOSIS — I739 Peripheral vascular disease, unspecified: Secondary | ICD-10-CM

## 2014-04-21 DIAGNOSIS — I10 Essential (primary) hypertension: Secondary | ICD-10-CM

## 2014-04-21 DIAGNOSIS — M79672 Pain in left foot: Secondary | ICD-10-CM

## 2014-04-21 DIAGNOSIS — Z87891 Personal history of nicotine dependence: Secondary | ICD-10-CM

## 2014-04-21 DIAGNOSIS — M1A0720 Idiopathic chronic gout, left ankle and foot, without tophus (tophi): Secondary | ICD-10-CM

## 2014-04-21 MED ORDER — AMLODIPINE BESYLATE 5 MG PO TABS: 5.0000 mg | ORAL_TABLET | Freq: Every day | ORAL | Status: DC

## 2014-04-21 NOTE — Progress Notes (Signed)
Subjective:    Patient ID: Chad Moses, male    DOB: 01/09/1961, 53 y.o.   MRN: [id number]  HPI Patient here for follow up of gout. The patient reports {that swelling has dissipated and pain is also decreased in the left ankle. He reports that gout attack is typically in the left lower extremity. He reports occasional joint stiffness to left lower extremity. Chad Moses is avoiding high purine foods and does not drink alcohol.    Patient is also  here for follow-up of elevated blood pressure. His accupril was increase and HCTZ was discontinued one week ago. He is not exercising and is not adherent to low salt diet. He does not check his blood pressure at home. Patient denies chest pain, dyspnea, fatigue, palpitations, syncope and tachypnea.  Cardiovascular risk factors: dyslipidemia, hypertension, obesity (BMI >= 30 kg/m2) and sedentary lifestyle.  History of target organ damage include peripheral artery disease.  Past Medical History  Diagnosis Date  . Hypertension   . HIV disease   . Gout    Review of Systems  Constitutional: Negative.   HENT: Negative.   Eyes: Negative.   Respiratory: Negative.   Cardiovascular: Negative.  Negative for chest pain, palpitations and leg swelling.  Gastrointestinal: Negative.   Endocrine: Negative.   Genitourinary: Negative.   Musculoskeletal: Positive for myalgias (occasional left foot pain).  Skin: Negative.   Allergic/Immunologic: Negative.   Neurological: Negative.   Hematological: Negative.   Psychiatric/Behavioral: Negative.        Objective:   Physical Exam  Constitutional: He is oriented to person, place, and time. He appears well-developed and well-nourished.  HENT:  Head: Normocephalic and atraumatic.  Right Ear: External ear normal.  Left Ear: External ear normal.  Mouth/Throat: Oropharynx is clear and moist.  Eyes: Conjunctivae are normal. Pupils are equal, round, and reactive to light.  Neck: Normal range of motion. Neck  supple.  Cardiovascular: Normal rate, regular rhythm, normal heart sounds and intact distal pulses.   Pulmonary/Chest: Effort normal and breath sounds normal.  Abdominal: Soft. Bowel sounds are normal.  Musculoskeletal: Normal range of motion.       Left ankle: He exhibits normal range of motion and no swelling. Tenderness (mild tenderness to palpation). Achilles tendon exhibits no pain.  Neurological: He is alert and oriented to person, place, and time. He has normal reflexes.  Skin: Skin is warm and dry.  Psychiatric: He has a normal mood and affect. His behavior is normal. Judgment and thought content normal.     BP 150/78 mmHg  Pulse 70  Temp(Src) 97.8 F (36.6 C) (Oral)  Resp 18  Ht 5\' 11"  (1.803 m)  Wt 211 lb (95.709 kg)  BMI 29.44 kg/m2     Assessment & Plan:   1. Essential hypertension Increased Enlapril to 20 mg 1 week ago, blood pressure remains increased. Will add 5 mg of Norvasc daily. Chad Moses will return to clinic in 1 week to re-check blood pressure.   - amLODipine (NORVASC) 5 MG tablet; Take 1 tablet (5 mg total) by mouth daily.  Dispense: 30 tablet; Refill: 0  2. Idiopathic chronic gout of left foot without tophus He states that left foot pain an swelling improved after discontinuing hydrochlorothiazide, starting a low purine diet, increasing water intake and 500 mg of Naproxen BID for 5 days.   3. Peripheral vascular disease Will re-check lipid panel during CPE. Patient reports that he quit smoking several months ago.   4. Left foot pain Left  foot pain has improved. Recommended that he continue to elevate left foot to heart level while at rest. He has on a regular shoe and has been ambulating without pain for the past several days.   5. HIV- Stable on current medication regimen. He is scheduled [facility]   6. Former smoker He reports that he quit smoking several months ago.    Ishia Tenorio M, FNP  RTC: 1 week for bp check  and 1 month for a hypertension follow-up

## 2014-04-21 NOTE — Patient Instructions (Signed)

## 2014-04-28 ENCOUNTER — Ambulatory Visit: Payer: Medicare Other

## 2014-04-28 VITALS — BP 140/82

## 2014-04-28 DIAGNOSIS — I10 Essential (primary) hypertension: Secondary | ICD-10-CM

## 2014-05-06 ENCOUNTER — Encounter (INDEPENDENT_AMBULATORY_CARE_PROVIDER_SITE_OTHER): Payer: Self-pay

## 2014-05-13 ENCOUNTER — Other Ambulatory Visit: Payer: Self-pay | Admitting: Family Medicine

## 2014-05-14 ENCOUNTER — Other Ambulatory Visit: Payer: Medicare Other

## 2014-05-18 ENCOUNTER — Other Ambulatory Visit: Payer: Self-pay | Admitting: Family Medicine

## 2014-05-21 ENCOUNTER — Ambulatory Visit (INDEPENDENT_AMBULATORY_CARE_PROVIDER_SITE_OTHER): Payer: Medicare Other | Admitting: Family Medicine

## 2014-05-21 VITALS — BP 127/71 | HR 79 | Temp 98.2°F | Resp 16 | Ht 71.0 in | Wt 209.0 lb

## 2014-05-21 DIAGNOSIS — M1A0720 Idiopathic chronic gout, left ankle and foot, without tophus (tophi): Secondary | ICD-10-CM

## 2014-05-21 DIAGNOSIS — Z87891 Personal history of nicotine dependence: Secondary | ICD-10-CM | POA: Insufficient documentation

## 2014-05-21 DIAGNOSIS — B20 Human immunodeficiency virus [HIV] disease: Secondary | ICD-10-CM | POA: Diagnosis not present

## 2014-05-21 DIAGNOSIS — E785 Hyperlipidemia, unspecified: Secondary | ICD-10-CM

## 2014-05-21 DIAGNOSIS — I10 Essential (primary) hypertension: Secondary | ICD-10-CM

## 2014-05-21 MED ORDER — AMLODIPINE BESYLATE 5 MG PO TABS: 5.0000 mg | ORAL_TABLET | Freq: Every day | ORAL | Status: DC

## 2014-05-21 NOTE — Progress Notes (Signed)
   Subjective:    Patient ID: Chad Moses, male    DOB: 10/15/1960, 53 y.o.   MRN: [id number]   HPI  [name], patient with a history of hypertension, gout, and HIV presents for a 1 month follow up of  left foot pain and hypertension. He reports that he feels well and is without complaint.  Left foot pain has resolved. He denies swelling, weakness, gait difficulty, or pain.   Patient is also following up for hypertension He is not exercising and is adherent to low salt diet. Chad Moses states that he has modified his diet and has lowered his overall fat and sugar intake.  Blood pressure is well controlled at home since blood pressure medications were adjusted. Patient denies chest pain, dyspnea, fatigue, palpitations, syncope and tachypnea.   Past Medical History  Diagnosis Date  . Hypertension   . HIV disease   . Gout     Review of Systems  Constitutional: Negative.   HENT: Negative.   Eyes: Negative.   Respiratory: Negative.   Cardiovascular: Negative.   Gastrointestinal: Negative.   Endocrine: Negative.   Genitourinary: Negative.   Musculoskeletal: Negative.   Skin: Negative.   Allergic/Immunologic: Negative.   Neurological: Negative.   Hematological: Negative.   Psychiatric/Behavioral: Negative.        Objective:   Physical Exam  Constitutional: He is oriented to person, place, and time. He appears well-developed and well-nourished.  HENT:  Head: Normocephalic and atraumatic.  Right Ear: External ear normal.  Left Ear: External ear normal.  Mouth/Throat: Oropharynx is clear and moist.  Eyes: Conjunctivae are normal. Pupils are equal, round, and reactive to light.  Neck: Normal range of motion. Neck supple.  Cardiovascular: Normal rate, normal heart sounds and intact distal pulses.   Pulmonary/Chest: Effort normal and breath sounds normal.  Abdominal: Soft. Bowel sounds are normal.  Musculoskeletal: Normal range of motion.  Neurological: He is alert and  oriented to person, place, and time.  Skin: Skin is warm and dry.  Psychiatric: He has a normal mood and affect. His behavior is normal. Judgment and thought content normal.         BP 127/71 mmHg  Pulse 79  Temp(Src) 98.2 F (36.8 C) (Oral)  Resp 16  Ht 5\' 11"  (1.803 m)  Wt 209 lb (94.802 kg)  BMI 29.16 kg/m2 Assessment & Plan:   1. Essential hypertension Blood pressure improved after adding Amlodipine 1 month ago. In addition patient has modified diet and is exercising.  - amLODipine (NORVASC) 5 MG tablet; Take 1 tablet (5 mg total) by mouth daily.  Dispense: 30 tablet; Refill: 2  2.  Idiopathic chronic gout of left foot without tophus Left foot pain has resolved on current medication regimen.    3. Human immunodeficiency virus (HIV) disease Stable on current medications. He reports that he takes medications consistently and has a follow up appt scheduled with Dr. Luciana Axeomer.   4. Hyperlipidemia LDL goal <130 Reviewed previous lipid panel.     5. Former smoker Patient has not smoked in months. He reports that he has no desire to smoke cigarettes.    RTC: As schedule for CPE with Dr. Wynona CanesMatthews Elisah Parmer M, FNP

## 2014-05-23 ENCOUNTER — Encounter: Payer: Self-pay | Admitting: Family Medicine

## 2014-05-23 ENCOUNTER — Other Ambulatory Visit: Payer: Self-pay | Admitting: Internal Medicine

## 2014-06-03 ENCOUNTER — Other Ambulatory Visit: Payer: Medicare Other

## 2014-06-03 DIAGNOSIS — B20 Human immunodeficiency virus [HIV] disease: Secondary | ICD-10-CM

## 2014-06-03 DIAGNOSIS — I10 Essential (primary) hypertension: Secondary | ICD-10-CM | POA: Diagnosis not present

## 2014-06-03 DIAGNOSIS — M109 Gout, unspecified: Secondary | ICD-10-CM | POA: Diagnosis not present

## 2014-06-03 LAB — COMPLETE METABOLIC PANEL WITH GFR
ALBUMIN: 4.2 g/dL (ref 3.5–5.2)
ALT: 28 U/L (ref 0–53)
AST: 20 U/L (ref 0–37)
Alkaline Phosphatase: 100 U/L (ref 39–117)
BUN: 17 mg/dL (ref 6–23)
CHLORIDE: 104 meq/L (ref 96–112)
CO2: 22 mEq/L (ref 19–32)
Calcium: 9.9 mg/dL (ref 8.4–10.5)
Creat: 1.78 mg/dL — ABNORMAL HIGH (ref 0.50–1.35)
GFR, Est African American: 50 mL/min — ABNORMAL LOW
GFR, Est Non African American: 43 mL/min — ABNORMAL LOW
Glucose, Bld: 137 mg/dL — ABNORMAL HIGH (ref 70–99)
POTASSIUM: 4.3 meq/L (ref 3.5–5.3)
Sodium: 137 mEq/L (ref 135–145)
TOTAL PROTEIN: 7.5 g/dL (ref 6.0–8.3)
Total Bilirubin: 0.5 mg/dL (ref 0.2–1.2)

## 2014-06-03 LAB — LIPID PANEL
Cholesterol: 213 mg/dL — ABNORMAL HIGH (ref 0–200)
HDL: 28 mg/dL — ABNORMAL LOW (ref >39)
LDL CALC: 153 mg/dL — AB (ref 0–99)
TRIGLYCERIDES: 160 mg/dL — AB (ref <150)
Total CHOL/HDL Ratio: 7.6 Ratio
VLDL: 32 mg/dL (ref 0–40)

## 2014-06-03 LAB — URIC ACID: URIC ACID, SERUM: 10.9 mg/dL — AB (ref 4.0–7.8)

## 2014-06-09 ENCOUNTER — Ambulatory Visit (INDEPENDENT_AMBULATORY_CARE_PROVIDER_SITE_OTHER): Payer: Medicare Other | Admitting: Internal Medicine

## 2014-06-09 ENCOUNTER — Encounter: Payer: Self-pay | Admitting: Internal Medicine

## 2014-06-09 ENCOUNTER — Other Ambulatory Visit: Payer: Self-pay | Admitting: Family Medicine

## 2014-06-09 VITALS — BP 123/68 | HR 86 | Temp 98.1°F | Resp 16 | Ht 71.0 in | Wt 214.0 lb

## 2014-06-09 DIAGNOSIS — R739 Hyperglycemia, unspecified: Secondary | ICD-10-CM

## 2014-06-09 DIAGNOSIS — I2510 Atherosclerotic heart disease of native coronary artery without angina pectoris: Secondary | ICD-10-CM

## 2014-06-09 DIAGNOSIS — H547 Unspecified visual loss: Secondary | ICD-10-CM | POA: Diagnosis not present

## 2014-06-09 DIAGNOSIS — E559 Vitamin D deficiency, unspecified: Secondary | ICD-10-CM | POA: Diagnosis not present

## 2014-06-09 DIAGNOSIS — Z1211 Encounter for screening for malignant neoplasm of colon: Secondary | ICD-10-CM

## 2014-06-09 DIAGNOSIS — R350 Frequency of micturition: Secondary | ICD-10-CM

## 2014-06-09 DIAGNOSIS — F329 Major depressive disorder, single episode, unspecified: Secondary | ICD-10-CM

## 2014-06-09 DIAGNOSIS — Z125 Encounter for screening for malignant neoplasm of prostate: Secondary | ICD-10-CM

## 2014-06-09 DIAGNOSIS — I639 Cerebral infarction, unspecified: Secondary | ICD-10-CM | POA: Insufficient documentation

## 2014-06-09 DIAGNOSIS — E785 Hyperlipidemia, unspecified: Secondary | ICD-10-CM

## 2014-06-09 DIAGNOSIS — F32A Depression, unspecified: Secondary | ICD-10-CM

## 2014-06-09 DIAGNOSIS — Z0000 Encounter for general adult medical examination without abnormal findings: Secondary | ICD-10-CM

## 2014-06-09 DIAGNOSIS — H479 Unspecified disorder of visual pathways: Secondary | ICD-10-CM | POA: Insufficient documentation

## 2014-06-09 DIAGNOSIS — E119 Type 2 diabetes mellitus without complications: Secondary | ICD-10-CM | POA: Insufficient documentation

## 2014-06-09 LAB — TSH: TSH: 1.301 u[IU]/mL (ref 0.350–4.500)

## 2014-06-09 MED ORDER — SERTRALINE HCL 50 MG PO TABS: 50.0000 mg | ORAL_TABLET | Freq: Every day | ORAL | Status: DC

## 2014-06-09 MED ORDER — PRAVASTATIN SODIUM 40 MG PO TABS: 40.0000 mg | ORAL_TABLET | Freq: Every day | ORAL | Status: DC

## 2014-06-09 NOTE — Progress Notes (Addendum)
Patient ID: Chad Moses, male   DOB: September 26, 1960, 54 y.o.   MRN: [id number]. Complete Physical CC: Frequent urination  HPI Patient presents for a complete physical.   Pt reports feelings of depression since the death of his brother. He reports that he has anhedonia, poor sleep and scored 13 on PHQ9 depression scale.   Pt also complains of urinary frequency and states that he uses the bathroom 4-5 x night. No feelings of urgency, dysuria or weak stream.  Pt also reports that he has had a CVA in the past and was on statin but stopped taking it sometime in the last several years.  He has normal blood pressure which today  is BP: 123/68 mmHg He does not workout. He denies chest pain, shortness of breath, dizziness.  He is not on cholesterol medication although he reveals that he had a CVA in 2002. His cholesterol is not at goal. The cholesterol last visit was:   Lab Results  Component Value Date   CHOL 213* 06/03/2014   HDL 28* 06/03/2014   LDLCALC 153* 06/03/2014   TRIG 160* 06/03/2014   CHOLHDL 7.6 06/03/2014   He does not have a diagnosis of  diabetes but has been complaining of polyuria  He has not been working on diet and exercise but   he is on baby ASA, he is on ACE/ARB and denies increased appetite, polydipsia and visual disturbances. He last A1c was mildly elevated at 6.1 1 year ago prior to joining our practice. Lab Results  Component Value Date   HGBA1C 6.1 10/18/2012   Patient is not on Vitamin D supplement.   No results found for: VD25OH   Last PSA was: No results found for: PSA  Current Medications:  Current Outpatient Prescriptions on File Prior to Visit  Medication Sig Dispense Refill  . amLODipine (NORVASC) 5 MG tablet Take 1 tablet (5 mg total) by mouth daily. 30 tablet 2  . aspirin 81 MG tablet Take 1 tablet (81 mg total) by mouth daily. 30 tablet 5  . atenolol (TENORMIN) 100 MG tablet TAKE 1 TABLET BY MOUTH EVERY DAY 30 tablet 0  . TIVICAY 50 MG tablet TAKE 1  TABLET BY MOUTH EVERY DAY 30 tablet 5  . TRUVADA 200-300 MG per tablet TAKE 1 TABLET BY MOUTH EVERY DAY 30 tablet 5   No current facility-administered medications on file prior to visit.   Health Maintenance:  Immunization History  Administered Date(s) Administered  . Hepatitis B 07/26/2011, 08/23/2011, 06/19/2012  . Influenza,inj,Quad PF,36+ Mos 02/19/2014  . PPD Test 03/05/2010  . Pneumococcal Polysaccharide-23 10/05/2007, 11/01/2012  . Tdap 02/19/2014   Zostavax: Although approved for > age 31, not recommended until age 28. DEXA: Not indicated Colonoscopy: Never had a screening colonoscopy.   Allergies: No Known Allergies Medical History:  Past Medical History  Diagnosis Date  . Hypertension   . HIV disease   . Depression   . Cortical visual impairment 2002  . Gout   . Stroke 2002   Surgical History:  Past Surgical History  Procedure Laterality Date  . Appendectomy     Family History:  Family History  Problem Relation Age of Onset  . Diabetes Mother   . Hypertension Mother   . Stroke Brother   . Cancer Brother    Social History:   History  Substance Use Topics  . Smoking status: Former Smoker -- 0.00 packs/day    Types: Cigarettes  . Smokeless tobacco: Never Used  Comment: given quitline info  . Alcohol Use: No    Review of Systems:  Review of Systems  Constitutional: Negative.   HENT: Negative.   Eyes: Positive for blurred vision (since CVA in 2002).  Respiratory: Negative.   Cardiovascular: Negative.   Gastrointestinal: Negative.   Genitourinary: Positive for frequency (at night).  Musculoskeletal: Negative.   Skin: Negative.   Neurological: Negative.   Endo/Heme/Allergies: Negative.   Psychiatric/Behavioral: Positive for depression.    Physical Exam: Estimated body mass index is 29.86 kg/(m^2) as calculated from the following:   Height as of this encounter: 5\' 11"  (1.803 m).   Weight as of this encounter: 214 lb (97.07 kg). BP 123/68  mmHg  Pulse 86  Temp(Src) 98.1 F (36.7 C) (Oral)  Resp 16  Ht 5\' 11"  (1.803 m)  Wt 214 lb (97.07 kg)  BMI 29.86 kg/m2 General Appearance: Well nourished, in no apparent distress.  Eyes: PERRLA, EOMs, conjunctiva no swelling or erythema, normal fundi and vessels.  Sinuses: No Frontal/maxillary tenderness  ENT/Mouth: Ext aud canals clear, normal light reflex with TMs without erythema, bulging. Good dentition. No erythema, swelling, or exudate on post pharynx. Tonsils not swollen or erythematous. Hearing normal.  Neck: Supple, thyroid normal. No bruits  Respiratory: Respiratory effort normal, BS equal bilaterally without rales, rhonchi, wheezing or stridor.  Cardio: RRR without murmurs, rubs or gallops. Brisk peripheral pulses without edema.  Chest: symmetric, with normal excursions and percussion.  Abdomen: Soft, nontender, no guarding, rebound, hernias, masses, or organomegaly. .  Lymphatics: Non tender without lymphadenopathy.  Genitourinary: No testicular masses on examination. Good rectal tone. No increase in size of prostate and not tenderness of prostate. Musculoskeletal: Full ROM all peripheral extremities,5/5 strength, and normal gait.  Skin: Warm, dry without rashes, lesions, ecchymosis. Neuro: Cranial nerves intact, reflexes equal bilaterally. Normal muscle tone, no cerebellar symptoms. Sensation intact.  Psych: Awake and oriented X 3, normal affect, Insight and Judgment appropriate.   EKG: WNL.  Assessment and Plan   1.  Visit for annual health examination - Pt has symptoms of depression - TSH  2. Depression - Pt with depression and issues of grief. I have referred to [facility] for counseling. Will also start on Zoloft. - sertraline (ZOLOFT) 50 MG tablet; Take 1 tablet (50 mg total) by mouth daily.  Dispense: 30 tablet; Refill: 3 - TSH  3. Screening for colon cancer - Ambulatory referral to Gastroenterology  4. Screening for prostate cancer -PSA  5. Cortical visual  impairment - Pt has visual impairment since his CVA in 2002,. He is followed by an opthalmologist but cannot remember her name.  6. ASCVD (arteriosclerotic cardiovascular disease) - Pt has known ASCVD in form of CVA with an elevated LDL. He was previously on a statin and has not taken for several years now. Will start on Pravachol - pravastatin (PRAVACHOL) 40 MG tablet; Take 1 tablet (40 mg total) by mouth daily.  Dispense: 90 tablet; Refill: 3  7. Elevated lipids - See above - pravastatin (PRAVACHOL) 40 MG tablet; Take 1 tablet (40 mg total) by mouth daily.  Dispense: 90 tablet; Refill: 3  8. Urinary frequency - This could also represent polyuria indicative of DM  - Hemoglobin A1C - PSA  9. Vitamin D deficiency - Check Vitamin D levels  10.Hyperglycemia - Hemoglobin A1C  11. Gout - Pt has had no flares since discontinuing HCTZ. He is not currently on prolhylaxis secondary to CKD   Discussed med's effects and SE's. Screening labs and  tests as requested with regular follow-up as recommended.  Harshith Pursell A. 3:48 PM [facility]

## 2014-06-10 ENCOUNTER — Encounter: Payer: Self-pay | Admitting: Internal Medicine

## 2014-06-10 ENCOUNTER — Ambulatory Visit (INDEPENDENT_AMBULATORY_CARE_PROVIDER_SITE_OTHER): Payer: Medicare Other | Admitting: Internal Medicine

## 2014-06-10 VITALS — BP 136/80 | HR 87 | Temp 97.9°F | Ht 71.0 in | Wt 211.0 lb

## 2014-06-10 DIAGNOSIS — F329 Major depressive disorder, single episode, unspecified: Secondary | ICD-10-CM | POA: Diagnosis not present

## 2014-06-10 DIAGNOSIS — I2510 Atherosclerotic heart disease of native coronary artery without angina pectoris: Secondary | ICD-10-CM

## 2014-06-10 DIAGNOSIS — B20 Human immunodeficiency virus [HIV] disease: Secondary | ICD-10-CM | POA: Diagnosis not present

## 2014-06-10 DIAGNOSIS — F32A Depression, unspecified: Secondary | ICD-10-CM

## 2014-06-10 LAB — HEMOGLOBIN A1C
Hgb A1c MFr Bld: 7 % — ABNORMAL HIGH (ref <5.7)
Mean Plasma Glucose: 154 mg/dL — ABNORMAL HIGH (ref <117)

## 2014-06-10 LAB — PSA: PSA: 0.46 ng/mL (ref <=4.00)

## 2014-06-10 NOTE — Assessment & Plan Note (Signed)
Doing well, pleased with regimen.  Labs today and rtc 4 months unless concerns.

## 2014-06-10 NOTE — Addendum Note (Signed)
Addended bySteva Colder: Yasmin Dibello on: 06/10/2014 09:57 AM   Modules accepted: Orders

## 2014-06-10 NOTE — Progress Notes (Signed)
  Subjective:    Patient ID: Chad Moses, male    DOB: 12/12/1960, 54 y.o.   MRN: [id number]  HPI He comes in for followup of his HIV. He has been on tivicay and Truvada which was new for him in July of this year after being on [place] and Truvada. Unfortunately, at that time he was taking his medication every other day because he had been out of refills and did not call. He then had notable detectable virus up to 798 copies. I then changed him to the once a day regimen and has been taking it since. No labs prior to this visit.  He denies any missed doses.     Has had some depression since losing his brother.  Feels he has not grieved enough since the funeral.       Review of Systems  Constitutional: Negative for fatigue.  HENT: Negative for sore throat and trouble swallowing.   Eyes: Negative for visual disturbance.  Respiratory: Negative for cough and shortness of breath.   Cardiovascular: Negative for chest pain.  Gastrointestinal: Negative for nausea and diarrhea.  Musculoskeletal: Negative for myalgias and arthralgias.  Neurological: Negative for dizziness and headaches.  Hematological: Negative for adenopathy.  Psychiatric/Behavioral: Negative for dysphoric mood.       Objective:   Physical Exam  Constitutional: He appears well-developed and well-nourished. No distress.  HENT:  Mouth/Throat: No oropharyngeal exudate.  Eyes: No scleral icterus.  Cardiovascular: Normal rate, regular rhythm and normal heart sounds.   No murmur heard. Pulmonary/Chest: Effort normal and breath sounds normal. No respiratory distress.  Lymphadenopathy:    He has no cervical adenopathy.  Skin: No rash noted.          Assessment & Plan:

## 2014-06-10 NOTE — Assessment & Plan Note (Signed)
Starting medication per his PCP.  Will schedule him with our counselor as well.

## 2014-06-11 ENCOUNTER — Other Ambulatory Visit: Payer: Self-pay | Admitting: Internal Medicine

## 2014-06-11 DIAGNOSIS — E1165 Type 2 diabetes mellitus with hyperglycemia: Secondary | ICD-10-CM

## 2014-06-11 DIAGNOSIS — IMO0002 Reserved for concepts with insufficient information to code with codable children: Secondary | ICD-10-CM

## 2014-06-11 LAB — HIV-1 RNA QUANT-NO REFLEX-BLD
HIV 1 RNA QUANT: 33 {copies}/mL — AB (ref <20)
HIV-1 RNA QUANT, LOG: 1.52 {Log} — AB (ref <1.30)

## 2014-06-11 LAB — HEPATITIS C ANTIBODY: HCV Ab: NEGATIVE

## 2014-06-11 LAB — T-HELPER CELL (CD4) - (RCID CLINIC ONLY)
CD4 T CELL HELPER: 23 % — AB (ref 33–55)
CD4 T Cell Abs: 320 /uL — ABNORMAL LOW (ref 400–2700)

## 2014-06-11 MED ORDER — GLIMEPIRIDE 2 MG PO TABS: 2.0000 mg | ORAL_TABLET | Freq: Every day | ORAL | Status: DC

## 2014-06-11 NOTE — Progress Notes (Signed)
Pt started on Amaryl instead of Metformin because of decreased GFR.

## 2014-06-11 NOTE — Progress Notes (Signed)
Quick Note:  Pt's labs reviewed and Hb A1c is elevated at 7.0.Given the symptoms of Polyuria will start on Metformin ER 500 mg daIALY. ______

## 2014-06-16 ENCOUNTER — Ambulatory Visit (INDEPENDENT_AMBULATORY_CARE_PROVIDER_SITE_OTHER): Payer: Medicare Other | Admitting: Internal Medicine

## 2014-06-16 VITALS — BP 145/82 | HR 87 | Temp 98.1°F | Resp 16 | Ht 71.0 in | Wt 208.0 lb

## 2014-06-16 DIAGNOSIS — E119 Type 2 diabetes mellitus without complications: Secondary | ICD-10-CM

## 2014-06-16 DIAGNOSIS — I2510 Atherosclerotic heart disease of native coronary artery without angina pectoris: Secondary | ICD-10-CM | POA: Diagnosis not present

## 2014-06-16 DIAGNOSIS — M25562 Pain in left knee: Secondary | ICD-10-CM

## 2014-06-16 DIAGNOSIS — M109 Gout, unspecified: Secondary | ICD-10-CM

## 2014-06-16 MED ORDER — OXYCODONE-ACETAMINOPHEN 5-325 MG PO TABS: 1.0000 | ORAL_TABLET | Freq: Three times a day (TID) | ORAL | Status: DC | PRN

## 2014-06-16 NOTE — Progress Notes (Signed)
Patient ID: Chad Moses, male   DOB: 1960-09-11, 54 y.o.   MRN: [id number]   Chad Moses, is a 54 y.o. male  WGN:[id number]  HQI:[id number]  DOB - [birth year]  CC:  Chief Complaint  Patient presents with  . Diabetes       HPI: Chad Moses is a 54 y.o. male here today to follow up for new diagnosis of DM II. He reports that he has still been having urinary frequency but only started his Amaryl today due to financial constraints. He also is having an acute gouty attack and filled his prescription for colchicine of which he has taken the 1st dose of 1.2 mg. He is still having pain in the left knee. He reports a sudden onset of knee pain which was initially red and warm. The pain was initially 10/10 and sharp in nature.  It is now 7/10 and still sharp in nature.  Patient has No headache, No chest pain, No abdominal pain - No Nausea, No new weakness tingling or numbness, No Cough - SOB.  No Known Allergies Past Medical History  Diagnosis Date  . Hypertension   . HIV disease   . Depression   . Cortical visual impairment 2002  . Gout   . Stroke 2002   Current Outpatient Prescriptions on File Prior to Visit  Medication Sig Dispense Refill  . amLODipine (NORVASC) 5 MG tablet Take 1 tablet (5 mg total) by mouth daily. 30 tablet 2  . aspirin 81 MG tablet Take 1 tablet (81 mg total) by mouth daily. 30 tablet 5  . atenolol (TENORMIN) 100 MG tablet TAKE 1 TABLET BY MOUTH EVERY DAY 30 tablet 0  . enalapril (VASOTEC) 20 MG tablet TAKE 1 TABLET BY MOUTH DAILY 30 tablet 0  . glimepiride (AMARYL) 2 MG tablet Take 1 tablet (2 mg total) by mouth daily with breakfast. 30 tablet 1  . pravastatin (PRAVACHOL) 40 MG tablet Take 1 tablet (40 mg total) by mouth daily. 90 tablet 3  . sertraline (ZOLOFT) 50 MG tablet Take 1 tablet (50 mg total) by mouth daily. 30 tablet 3  . TIVICAY 50 MG tablet TAKE 1 TABLET BY MOUTH EVERY DAY 30 tablet 5  . TRUVADA 200-300 MG per tablet TAKE 1 TABLET BY MOUTH EVERY  DAY 30 tablet 5   No current facility-administered medications on file prior to visit.   Family History  Problem Relation Age of Onset  . Diabetes Mother   . Hypertension Mother   . Stroke Brother   . Cancer Brother    History   Social History  . Marital Status: Divorced    Spouse Name: N/A    Number of Children: N/A  . Years of Education: N/A   Occupational History  . Not on file.   Social History Main Topics  . Smoking status: Former Smoker -- 0.00 packs/day    Types: Cigarettes  . Smokeless tobacco: Never Used     Comment: given quitline info  . Alcohol Use: No  . Drug Use: No  . Sexual Activity:    Partners: Female, Male     Comment: pt. given condoms   Other Topics Concern  . Not on file   Social History Narrative    Review of Systems: Constitutional: Negative for fever, chills, diaphoresis, activity change, appetite change and fatigue. HENT: Negative for ear pain, nosebleeds, congestion, facial swelling, rhinorrhea, neck pain, neck stiffness and ear discharge.  Eyes: Negative for pain, discharge, redness, itching and visual disturbance. Respiratory:  Negative for cough, choking, chest tightness, shortness of breath, wheezing and stridor.  Cardiovascular: Negative for chest pain, palpitations and leg swelling. Gastrointestinal: Negative for abdominal distention. Genitourinary: Negative for dysuria, hematuria, flank pain, decreased urine volume, difficulty urinating.  Neurological: Negative for dizziness, tremors, seizures, syncope, facial asymmetry, speech difficulty, weakness, light-headedness, numbness and headaches.  Hematological: Negative for adenopathy. Does not bruise/bleed easily. Psychiatric/Behavioral: Negative for hallucinations, behavioral problems, confusion, dysphoric mood, decreased concentration and agitation.     Objective:   Filed Vitals:   06/16/14 1641  BP: 145/82  Pulse: 87  Temp: 98.1 F (36.7 C)  Resp: 16    Physical  Exam: Constitutional: Patient appears well-developed and well-nourished. No distress. HENT: Normocephalic, atraumatic, External right and left ear normal. Oropharynx is clear and moist.  Eyes: Conjunctivae and EOM are normal. PERRLA, no scleral icterus. Neck: Normal ROM. Neck supple. No JVD. No tracheal deviation. No thyromegaly. CVS: RRR, S1/S2 +, no murmurs, no gallops, no carotid bruit.  Pulmonary: Effort and breath sounds normal, no stridor, rhonchi, wheezes, rales.  Abdominal: Soft. BS +, no distension, tenderness, rebound or guarding.  Musculoskeletal: Normal range of motion except in left knee which s warm, tender and mildly erythematous in nature.  Lmphadenopathy: No lymphadenopathy noted, cervical, inguinal or axillary Neuro: Alert. Normal reflexes, muscle tone coordination. No cranial nerve deficit. Skin: Skin is warm and dry. No rash noted. Not diaphoretic. No erythema. No pallor. Psychiatric: Normal mood and affect. Behavior, judgment, thought content normal.   Lab Results  Component Value Date   WBC 5.4 09/17/2013   HGB 15.4 09/17/2013   HCT 42.9 09/17/2013   MCV 83.0 09/17/2013   PLT 244 09/17/2013   Lab Results  Component Value Date   CREATININE 1.78* 06/03/2014   BUN 17 06/03/2014   NA 137 06/03/2014   K 4.3 06/03/2014   CL 104 06/03/2014   CO2 22 06/03/2014    Lab Results  Component Value Date   HGBA1C 7.0* 06/09/2014   Lipid Panel     Component Value Date/Time   CHOL 213* 06/03/2014 0826   TRIG 160* 06/03/2014 0826   HDL 28* 06/03/2014 0826   CHOLHDL 7.6 06/03/2014 0826   VLDL 32 06/03/2014 0826   LDLCALC 153* 06/03/2014 0826       Assessment and plan:   1. Diabetes mellitus type 2 in nonobese - Pt instructed in food choices in diabetes. He was educated about 15 g of carbohydrates equivalent to carb exchange. We discussed hypoglycemia and it's symptoms. Pt verbalized understanding of the above concepts.  - Pt to start Amaryl today. -  Microalbumin, urine  2. Acute gouty arthritis/ Acute knee pain, left - Pt presenting with acute gout. He has started on Colchicine. He is still having significant pain. I the setting of renal insufficiency, I will refrain from prescribing NSAIDs and will treat with short course of Percocet. - oxyCODONE-acetaminophen (ROXICET) 5-325 MG per tablet; Take 1 tablet by mouth every 8 (eight) hours as needed for severe pain.  Dispense: 20 tablet; Refill: 0    Return in about 3 months (around 09/15/2014) for DM II, HTN.  The patient was given clear instructions to go to ER or return to medical center if symptoms don't improve, worsen or new problems develop. The patient verbalized understanding. The patient was told to call to get lab results if they haven't heard anything in the next week.     This note has been created with Teaching laboratory technicianDragon speech recognition software and smart  Lobbyist. Any transcriptional errors are unintentional.    Nafisah Runions A., MD [facility] [place], [place] [phone]   06/16/2014, 6:06 PM

## 2014-06-17 LAB — MICROALBUMIN, URINE: MICROALB UR: 11.5 mg/dL — AB (ref <2.0)

## 2014-06-19 ENCOUNTER — Ambulatory Visit: Payer: Medicare Other

## 2014-06-20 ENCOUNTER — Other Ambulatory Visit: Payer: Self-pay | Admitting: Family Medicine

## 2014-06-21 ENCOUNTER — Other Ambulatory Visit: Payer: Self-pay | Admitting: Internal Medicine

## 2014-07-17 ENCOUNTER — Ambulatory Visit: Payer: Medicare Other

## 2014-07-17 ENCOUNTER — Other Ambulatory Visit: Payer: Self-pay | Admitting: Internal Medicine

## 2014-07-17 DIAGNOSIS — F4323 Adjustment disorder with mixed anxiety and depressed mood: Secondary | ICD-10-CM

## 2014-07-17 MED ORDER — ENALAPRIL MALEATE 20 MG PO TABS: 20.0000 mg | ORAL_TABLET | Freq: Every day | ORAL | Status: DC

## 2014-07-17 NOTE — BH Specialist Note (Signed)
Chad Moses and I completed a treatment plan today with a goal to: maintain his good mood; and to increase socialization.  He talked about his recent news that he has type II diabetes and this took him back to when his brother was diagnosed with it.  He said it is very helpful to come in and talk.  Also in the session was social work Tax inspectorintern, Baird CancerFelicia Brown, who not only helped with the treatment goals, she also gave him valuable information on the diabetes clinic in this building where he can schedule an appointment to get nutritional advice on how to manage his diabetes.  Plan to meet in 4 weeks. Franne FortsKenny Elin Seats, LCSW  Whodas: [phone]

## 2014-07-17 NOTE — Telephone Encounter (Signed)
Refill for vasotec sent into pharmacy via e-script. Thanks!

## 2014-08-06 ENCOUNTER — Encounter: Payer: Self-pay | Admitting: Internal Medicine

## 2014-08-14 ENCOUNTER — Ambulatory Visit: Payer: Medicare Other

## 2014-08-25 ENCOUNTER — Ambulatory Visit: Payer: Medicare Other

## 2014-08-25 DIAGNOSIS — F4323 Adjustment disorder with mixed anxiety and depressed mood: Secondary | ICD-10-CM

## 2014-08-25 NOTE — BH Specialist Note (Signed)
Chad Moses was in a pleasant mood today and reported that he recently visited with his children and grandchildren for a week in [place].  He said that this lifted his spirits and he feels he doesn't really need to come to counseling anymore, but enjoyed meeting me and enjoyed talking with me.  He talked some about his long range plan [place] eventually [place] to [place] near the [place], but still be close enough to [place] to visit his kids some.  No follow up appointment made, but I told him that if something came up and he needed to talk, he could always call and schedule an appointment. Franne FortsKenny Jaedyn Marrufo, LCSW  Whodas: [phone]

## 2014-09-08 ENCOUNTER — Ambulatory Visit: Payer: Medicare Other | Admitting: Internal Medicine

## 2014-09-10 ENCOUNTER — Encounter: Payer: Self-pay | Admitting: Gastroenterology

## 2014-09-20 ENCOUNTER — Other Ambulatory Visit: Payer: Self-pay | Admitting: Internal Medicine

## 2014-10-05 ENCOUNTER — Other Ambulatory Visit: Payer: Self-pay | Admitting: Family Medicine

## 2014-10-16 ENCOUNTER — Ambulatory Visit: Payer: Medicare Other | Admitting: Internal Medicine

## 2014-10-27 ENCOUNTER — Ambulatory Visit (AMBULATORY_SURGERY_CENTER): Payer: Self-pay

## 2014-10-27 VITALS — Ht 71.0 in | Wt 198.0 lb

## 2014-10-27 DIAGNOSIS — Z1211 Encounter for screening for malignant neoplasm of colon: Secondary | ICD-10-CM

## 2014-10-27 MED ORDER — SUPREP BOWEL PREP KIT 17.5-3.13-1.6 GM/177ML PO SOLN: 1.0000 | Freq: Once | ORAL | Status: DC

## 2014-10-27 NOTE — Progress Notes (Signed)
No allergies to eggs or soy No diet/weight loss meds No home oxygen No past problems to anesthesia  No email no computer 

## 2014-11-13 ENCOUNTER — Ambulatory Visit (AMBULATORY_SURGERY_CENTER): Payer: Medicare Other | Admitting: Gastroenterology

## 2014-11-13 ENCOUNTER — Encounter: Payer: Self-pay | Admitting: Gastroenterology

## 2014-11-13 VITALS — BP 119/72 | HR 69 | Temp 96.4°F | Resp 23 | Ht 71.0 in | Wt 198.0 lb

## 2014-11-13 DIAGNOSIS — Z1211 Encounter for screening for malignant neoplasm of colon: Secondary | ICD-10-CM

## 2014-11-13 DIAGNOSIS — E119 Type 2 diabetes mellitus without complications: Secondary | ICD-10-CM | POA: Diagnosis not present

## 2014-11-13 DIAGNOSIS — Z8673 Personal history of transient ischemic attack (TIA), and cerebral infarction without residual deficits: Secondary | ICD-10-CM | POA: Diagnosis not present

## 2014-11-13 DIAGNOSIS — F329 Major depressive disorder, single episode, unspecified: Secondary | ICD-10-CM | POA: Diagnosis not present

## 2014-11-13 DIAGNOSIS — I10 Essential (primary) hypertension: Secondary | ICD-10-CM | POA: Diagnosis not present

## 2014-11-13 DIAGNOSIS — K649 Unspecified hemorrhoids: Secondary | ICD-10-CM | POA: Diagnosis not present

## 2014-11-13 LAB — GLUCOSE, CAPILLARY
GLUCOSE-CAPILLARY: 108 mg/dL — AB (ref 65–99)
Glucose-Capillary: 97 mg/dL (ref 65–99)

## 2014-11-13 MED ORDER — SODIUM CHLORIDE 0.9 % IV SOLN
500.0000 mL | INTRAVENOUS | Status: DC
Start: 2014-11-13 — End: 2014-11-14

## 2014-11-13 NOTE — Op Note (Addendum)
[facility] [address] [place], [address]   COLONOSCOPY PROCEDURE REPORT  PATIENT: Chad, Moses  MR#: [id number] BIRTHDATE: 1960-10-27 , 53  yrs. old GENDER: male ENDOSCOPIST: Louis Meckel, MD REFERRED BY: PROCEDURE DATE:  11/13/2014 PROCEDURE:   Colonoscopy, screening First Screening Colonoscopy - Avg.  risk and is 50 yrs.  old or older Yes.  Prior Negative Screening - Now for repeat screening. N/A  History of Adenoma - Now for follow-up colonoscopy & has been > or = to 3 yrs.  N/A  Recommend repeat exam, <10 yrs? No ASA CLASS:   Class II INDICATIONS:Colorectal Neoplasm Risk Assessment for this procedure is average risk. MEDICATIONS: Monitored anesthesia care, Propofol 200 mg IV, and Lidocaine 40 mg IV  DESCRIPTION OF PROCEDURE:   After the risks benefits and alternatives of the procedure were thoroughly explained, informed consent was obtained.  The digital rectal exam revealed no abnormalities of the rectum.   The LB Olympus Loaner [id number] endoscope was introduced through the anus and advanced to the ileum. No adverse events experienced.   The quality of the prep was (Suprep was used) excellent.  The instrument was then slowly withdrawn as the colon was fully examined. Estimated blood loss is zero unless otherwise noted in this procedure report.      COLON FINDINGS: Internal hemorrhoids were found.   The examination was otherwise normal.  Retroflexed views revealed no abnormalities. The time to cecum = 6:30 Withdrawal time =      The scope was withdrawn and the procedure completed. COMPLICATIONS: There were no immediate complications.  ENDOSCOPIC IMPRESSION: 1.   Internal hemorrhoids 2.   The examination was otherwise normal  RECOMMENDATIONS: Continue current colorectal screening recommendations for "routine risk" patients with a repeat colonoscopy in 10 years.  eSigned:  Louis Meckel, MD 11/13/2014 9:23 AM Revised: 11/13/2014 9:23  AM  cc: Marlana Salvage, MD

## 2014-11-13 NOTE — Progress Notes (Signed)
Stable to RR 

## 2014-11-13 NOTE — Patient Instructions (Signed)
Discharge instructions given. Handout on hemorrhoids. Resume previous medications. YOU HAD AN ENDOSCOPIC PROCEDURE TODAY AT THE [facility]:   Refer to the procedure report that was given to you for any specific questions about what was found during the examination.  If the procedure report does not answer your questions, please call your gastroenterologist to clarify.  If you requested that your care partner not be given the details of your procedure findings, then the procedure report has been included in a sealed envelope for you to review at your convenience later.  YOU SHOULD EXPECT: Some feelings of bloating in the abdomen. Passage of more gas than usual.  Walking can help get rid of the air that was put into your GI tract during the procedure and reduce the bloating. If you had a lower endoscopy (such as a colonoscopy or flexible sigmoidoscopy) you may notice spotting of blood in your stool or on the toilet paper. If you underwent a bowel prep for your procedure, you may not have a normal bowel movement for a few days.  Please Note:  You might notice some irritation and congestion in your nose or some drainage.  This is from the oxygen used during your procedure.  There is no need for concern and it should clear up in a day or so.  SYMPTOMS TO REPORT IMMEDIATELY:   Following lower endoscopy (colonoscopy or flexible sigmoidoscopy):  Excessive amounts of blood in the stool  Significant tenderness or worsening of abdominal pains  Swelling of the abdomen that is new, acute  Fever of 100F or higher   For urgent or emergent issues, a gastroenterologist can be reached at any hour by calling [phone].   DIET: Your first meal following the procedure should be a small meal and then it is ok to progress to your normal diet. Heavy or fried foods are harder to digest and may make you feel nauseous or bloated.  Likewise, meals heavy in dairy and vegetables can increase bloating.   Drink plenty of fluids but you should avoid alcoholic beverages for 24 hours.  ACTIVITY:  You should plan to take it easy for the rest of today and you should NOT DRIVE or use heavy machinery until tomorrow (because of the sedation medicines used during the test).    FOLLOW UP: Our staff will call the number listed on your records the next business day following your procedure to check on you and address any questions or concerns that you may have regarding the information given to you following your procedure. If we do not reach you, we will leave a message.  However, if you are feeling well and you are not experiencing any problems, there is no need to return our call.  We will assume that you have returned to your regular daily activities without incident.  If any biopsies were taken you will be contacted by phone or by letter within the next 1-3 weeks.  Please call us at [phone] if you have not heard about the biopsies in 3 weeks.    SIGNATURES/CONFIDENTIALITY: You and/or your care partner have signed paperwork which will be entered into your electronic medical record.  These signatures attest to the fact that that the information above on your After Visit Summary has been reviewed and is understood.  Full responsibility of the confidentiality of this discharge information lies with you and/or your care-partner. 

## 2014-11-14 ENCOUNTER — Telehealth: Payer: Self-pay | Admitting: *Deleted

## 2014-11-14 NOTE — Telephone Encounter (Signed)
  Follow up Call-  Call back number 11/13/2014  Post procedure Call Back phone  # [phone] cell  Permission to leave phone message Yes     Patient questions:  Message left with niece to call us if necessary.

## 2015-02-25 ENCOUNTER — Other Ambulatory Visit: Payer: Self-pay

## 2015-02-25 ENCOUNTER — Other Ambulatory Visit: Payer: Self-pay | Admitting: Family Medicine

## 2015-02-25 MED ORDER — ENALAPRIL MALEATE 20 MG PO TABS: 20.0000 mg | ORAL_TABLET | Freq: Every day | ORAL | Status: DC

## 2015-03-19 ENCOUNTER — Other Ambulatory Visit: Payer: Self-pay | Admitting: Internal Medicine

## 2015-04-16 ENCOUNTER — Other Ambulatory Visit: Payer: Self-pay | Admitting: *Deleted

## 2015-04-16 ENCOUNTER — Other Ambulatory Visit: Payer: Self-pay | Admitting: Internal Medicine

## 2015-04-16 MED ORDER — DOLUTEGRAVIR SODIUM 50 MG PO TABS: 50.0000 mg | ORAL_TABLET | Freq: Every day | ORAL | Status: DC

## 2015-04-16 MED ORDER — EMTRICITABINE-TENOFOVIR DF 200-300 MG PO TABS: 1.0000 | ORAL_TABLET | Freq: Every day | ORAL | Status: DC

## 2015-05-14 ENCOUNTER — Telehealth: Payer: Self-pay | Admitting: *Deleted

## 2015-05-14 ENCOUNTER — Other Ambulatory Visit: Payer: Self-pay | Admitting: Internal Medicine

## 2015-05-14 NOTE — Telephone Encounter (Signed)
Patient requesting refills.  Patient last seen 06/2014, was supposed to follow up 10/2014 but did not.  Patient notified last 3 refills that he needed an appointment. RN contacted patient, left voicemail instructing him that he needed to call [facility] for refills.  Request denied until patient contacts [facility]. Andree CossHowell, Emmalene Kattner M, RN

## 2015-05-15 ENCOUNTER — Other Ambulatory Visit: Payer: Medicare Other

## 2015-05-15 ENCOUNTER — Other Ambulatory Visit: Payer: Self-pay | Admitting: Internal Medicine

## 2015-05-15 DIAGNOSIS — Z113 Encounter for screening for infections with a predominantly sexual mode of transmission: Secondary | ICD-10-CM

## 2015-05-15 DIAGNOSIS — Z79899 Other long term (current) drug therapy: Secondary | ICD-10-CM

## 2015-05-15 DIAGNOSIS — Z21 Asymptomatic human immunodeficiency virus [HIV] infection status: Secondary | ICD-10-CM | POA: Diagnosis not present

## 2015-05-15 LAB — RPR

## 2015-05-15 LAB — CBC WITH DIFFERENTIAL/PLATELET
Basophils Absolute: 0 K/uL (ref 0.0–0.1)
Basophils Relative: 0 % (ref 0–1)
Eosinophils Absolute: 0.3 K/uL (ref 0.0–0.7)
Eosinophils Relative: 4 % (ref 0–5)
HCT: 43.8 % (ref 39.0–52.0)
Hemoglobin: 14.7 g/dL (ref 13.0–17.0)
Lymphocytes Relative: 32 % (ref 12–46)
Lymphs Abs: 2.3 K/uL (ref 0.7–4.0)
MCH: 28.5 pg (ref 26.0–34.0)
MCHC: 33.6 g/dL (ref 30.0–36.0)
MCV: 85 fL (ref 78.0–100.0)
MPV: 10 fL (ref 8.6–12.4)
Monocytes Absolute: 0.6 K/uL (ref 0.1–1.0)
Monocytes Relative: 8 % (ref 3–12)
Neutro Abs: 4 K/uL (ref 1.7–7.7)
Neutrophils Relative %: 56 % (ref 43–77)
Platelets: 246 K/uL (ref 150–400)
RBC: 5.15 MIL/uL (ref 4.22–5.81)
RDW: 15.9 % — ABNORMAL HIGH (ref 11.5–15.5)
WBC: 7.2 K/uL (ref 4.0–10.5)

## 2015-05-15 LAB — LIPID PANEL
CHOL/HDL RATIO: 7 ratio — AB (ref <=5.0)
Cholesterol: 202 mg/dL — ABNORMAL HIGH (ref 125–200)
HDL: 29 mg/dL — AB (ref >=40)
LDL CALC: 142 mg/dL — AB (ref <130)
Triglycerides: 155 mg/dL — ABNORMAL HIGH (ref <150)
VLDL: 31 mg/dL — ABNORMAL HIGH (ref <30)

## 2015-05-15 LAB — COMPREHENSIVE METABOLIC PANEL WITH GFR
ALT: 25 U/L (ref 9–46)
AST: 17 U/L (ref 10–35)
Albumin: 4 g/dL (ref 3.6–5.1)
Alkaline Phosphatase: 131 U/L — ABNORMAL HIGH (ref 40–115)
BUN: 17 mg/dL (ref 7–25)
CO2: 23 mmol/L (ref 20–31)
Calcium: 9.5 mg/dL (ref 8.6–10.3)
Chloride: 105 mmol/L (ref 98–110)
Creat: 1.6 mg/dL — ABNORMAL HIGH (ref 0.70–1.33)
Glucose, Bld: 168 mg/dL — ABNORMAL HIGH (ref 65–99)
Potassium: 4.4 mmol/L (ref 3.5–5.3)
Sodium: 140 mmol/L (ref 135–146)
Total Bilirubin: 0.3 mg/dL (ref 0.2–1.2)
Total Protein: 7.4 g/dL (ref 6.1–8.1)

## 2015-05-15 LAB — T-HELPER CELL (CD4) - (RCID CLINIC ONLY)
CD4 % Helper T Cell: 23 % — ABNORMAL LOW (ref 33–55)
CD4 T CELL ABS: 600 /uL (ref 400–2700)

## 2015-05-18 LAB — HIV-1 RNA QUANT-NO REFLEX-BLD: HIV-1 RNA Quant, Log: <1.3 Log copies/mL (ref <1.30)

## 2015-05-23 ENCOUNTER — Other Ambulatory Visit: Payer: Self-pay | Admitting: Internal Medicine

## 2015-05-26 ENCOUNTER — Other Ambulatory Visit (HOSPITAL_COMMUNITY)
Admission: RE | Admit: 2015-05-26 | Discharge: 2015-05-26 | Disposition: A | Discharge status: Home / Self Care | Payer: Medicare Other | Source: Ambulatory Visit | Attending: Internal Medicine | Admitting: Internal Medicine

## 2015-05-26 ENCOUNTER — Encounter: Payer: Self-pay | Admitting: Internal Medicine

## 2015-05-26 ENCOUNTER — Ambulatory Visit (INDEPENDENT_AMBULATORY_CARE_PROVIDER_SITE_OTHER): Payer: Medicare Other | Admitting: Internal Medicine

## 2015-05-26 ENCOUNTER — Other Ambulatory Visit: Payer: Self-pay | Admitting: *Deleted

## 2015-05-26 VITALS — BP 154/85 | HR 106 | Temp 97.6°F | Wt 208.0 lb

## 2015-05-26 DIAGNOSIS — Z113 Encounter for screening for infections with a predominantly sexual mode of transmission: Secondary | ICD-10-CM | POA: Insufficient documentation

## 2015-05-26 DIAGNOSIS — N182 Chronic kidney disease, stage 2 (mild): Secondary | ICD-10-CM

## 2015-05-26 DIAGNOSIS — I10 Essential (primary) hypertension: Secondary | ICD-10-CM | POA: Diagnosis not present

## 2015-05-26 DIAGNOSIS — N189 Chronic kidney disease, unspecified: Secondary | ICD-10-CM | POA: Diagnosis not present

## 2015-05-26 DIAGNOSIS — B20 Human immunodeficiency virus [HIV] disease: Secondary | ICD-10-CM | POA: Diagnosis not present

## 2015-05-26 DIAGNOSIS — I2510 Atherosclerotic heart disease of native coronary artery without angina pectoris: Secondary | ICD-10-CM

## 2015-05-26 DIAGNOSIS — Z23 Encounter for immunization: Secondary | ICD-10-CM | POA: Diagnosis present

## 2015-05-26 DIAGNOSIS — R21 Rash and other nonspecific skin eruption: Secondary | ICD-10-CM | POA: Diagnosis not present

## 2015-05-26 DIAGNOSIS — Z720 Tobacco use: Secondary | ICD-10-CM | POA: Diagnosis not present

## 2015-05-26 DIAGNOSIS — K0889 Other specified disorders of teeth and supporting structures: Secondary | ICD-10-CM | POA: Diagnosis not present

## 2015-05-26 MED ORDER — DOLUTEGRAVIR SODIUM 50 MG PO TABS: 50.0000 mg | ORAL_TABLET | Freq: Every day | ORAL | Status: DC

## 2015-05-26 MED ORDER — VARENICLINE TARTRATE 0.5 MG X 11 & 1 MG X 42 PO MISC: ORAL | Status: DC

## 2015-05-26 MED ORDER — VARENICLINE TARTRATE 1 MG PO TABS: 1.0000 mg | ORAL_TABLET | Freq: Two times a day (BID) | ORAL | Status: DC

## 2015-05-26 MED ORDER — EMTRICITABINE-TENOFOVIR AF 200-25 MG PO TABS: 1.0000 | ORAL_TABLET | Freq: Every day | ORAL | Status: DC

## 2015-05-26 MED ORDER — TRIAMCINOLONE ACETONIDE 0.1 % EX OINT: TOPICAL_OINTMENT | Freq: Two times a day (BID) | CUTANEOUS | Status: DC

## 2015-05-26 NOTE — Assessment & Plan Note (Signed)
He is going well and will change to Descovy.  Will do urine cytology today.

## 2015-05-26 NOTE — Assessment & Plan Note (Signed)
He is going to call his PCP to get back in.

## 2015-05-26 NOTE — Assessment & Plan Note (Signed)
It is stable, though will change to Descovy.

## 2015-05-26 NOTE — Assessment & Plan Note (Signed)
I discussed the need to completely quit.  He is smoking Black and Mild now.  Will start Chantix.  Instructions given.  PIck a quit date.

## 2015-05-26 NOTE — Assessment & Plan Note (Signed)
Refilled his anti fungal

## 2015-05-26 NOTE — Assessment & Plan Note (Signed)
Will send him to our dental clinic

## 2015-05-26 NOTE — Progress Notes (Signed)
CC: Follow up for HIV  Interval history: Currently is asymptomatic and well-controlled on Tivicay and Truvada.  Since last visit he has had a screening colonoscopy.  He has not been here in 1 year.   Also has not seen his PCP in 1 year.  Is out of his BP meds.  He continues to smoke.  Has no associated n/v.  He denies any missed doses.     Also has HTN and renal insufficiency.    Prior to Admission medications   Medication Sig Start Date End Date Taking? Authorizing Provider  amLODipine (NORVASC) 5 MG tablet TAKE 1 TABLET BY MOUTH EVERY DAY 06/20/14   Altha HarmMichelle A Matthews, MD  aspirin 81 MG tablet Take 1 tablet (81 mg total) by mouth daily. 06/11/13   Gardiner Barefootobert W Nakeia Calvi, MD  atenolol (TENORMIN) 100 MG tablet TAKE 1 TABLET BY MOUTH EVERY DAY 05/23/14   Gardiner Barefootobert W Kahley Leib, MD  dolutegravir (TIVICAY) 50 MG tablet Take 1 tablet (50 mg total) by mouth daily. 04/16/15   Gardiner Barefootobert W Rochella Benner, MD  emtricitabine-tenofovir (TRUVADA) 200-300 MG tablet Take 1 tablet by mouth daily. 04/16/15   Gardiner Barefootobert W Floreine Kingdon, MD  enalapril (VASOTEC) 20 MG tablet TAKE 1 TABLET(20 MG) BY MOUTH DAILY 02/25/15   Massie MaroonLachina M Hollis, FNP    Review of Systems Constitutional: negative for headache Gastrointestinal: negative for diarrhea Musculoskeletal: negative for myalgias and arthralgias All other systems reviewed and are negative   Physical Exam: CONSTITUTIONAL:in no apparent distress and alert  Filed Vitals:   05/26/15 0836  BP: 154/85  Pulse: 106  Temp: 97.6 F (36.4 C)   Eyes: anicteric HENT: no thrush, no cervical lymphadenopathy Respiratory: Normal respiratory effort; CTA B  Lab Results  Component Value Date   HIV1RNAQUANT <20 05/15/2015   HIV1RNAQUANT 33* 06/10/2014   HIV1RNAQUANT 22* 01/30/2014   No components found for: HIV1GENOTYPRPLUS No components found for: THELPERCELL

## 2015-05-27 LAB — URINE CYTOLOGY ANCILLARY ONLY
Chlamydia: NEGATIVE
NEISSERIA GONORRHEA: NEGATIVE
Trichomonas: NEGATIVE

## 2015-06-02 ENCOUNTER — Telehealth: Payer: Self-pay | Admitting: *Deleted

## 2015-06-02 NOTE — Telephone Encounter (Signed)
Chantix has been approved through 11/29/15. Chad MolaJacqueline Kinzly Pierrelouis

## 2015-06-11 ENCOUNTER — Other Ambulatory Visit: Payer: Self-pay | Admitting: Internal Medicine

## 2015-06-22 ENCOUNTER — Telehealth: Payer: Self-pay | Admitting: *Deleted

## 2015-06-22 NOTE — Telephone Encounter (Signed)
PA for Chantix rx completed and sent to plan through CoverMyMeds.  May take up to 72 hours for a response.

## 2015-09-10 ENCOUNTER — Other Ambulatory Visit: Payer: Medicare Other

## 2015-09-10 DIAGNOSIS — B20 Human immunodeficiency virus [HIV] disease: Secondary | ICD-10-CM | POA: Diagnosis not present

## 2015-09-10 LAB — COMPLETE METABOLIC PANEL WITH GFR
ALT: 14 U/L (ref 9–46)
AST: 15 U/L (ref 10–35)
Albumin: 3.9 g/dL (ref 3.6–5.1)
Alkaline Phosphatase: 87 U/L (ref 40–115)
BUN: 19 mg/dL (ref 7–25)
CALCIUM: 9.3 mg/dL (ref 8.6–10.3)
CHLORIDE: 106 mmol/L (ref 98–110)
CO2: 23 mmol/L (ref 20–31)
CREATININE: 1.73 mg/dL — AB (ref 0.70–1.33)
GFR, EST AFRICAN AMERICAN: 51 mL/min — AB (ref >=60)
GFR, Est Non African American: 44 mL/min — ABNORMAL LOW (ref >=60)
Glucose, Bld: 111 mg/dL — ABNORMAL HIGH (ref 65–99)
POTASSIUM: 4.4 mmol/L (ref 3.5–5.3)
Sodium: 141 mmol/L (ref 135–146)
Total Bilirubin: 0.3 mg/dL (ref 0.2–1.2)
Total Protein: 7.1 g/dL (ref 6.1–8.1)

## 2015-09-11 LAB — T-HELPER CELL (CD4) - (RCID CLINIC ONLY)
CD4 T CELL HELPER: 24 % — AB (ref 33–55)
CD4 T Cell Abs: 540 /uL (ref 400–2700)

## 2015-09-11 LAB — HIV-1 RNA QUANT-NO REFLEX-BLD: HIV 1 RNA Quant: <20 copies/mL (ref <20)

## 2015-09-24 ENCOUNTER — Ambulatory Visit (INDEPENDENT_AMBULATORY_CARE_PROVIDER_SITE_OTHER): Payer: Medicare Other | Admitting: Internal Medicine

## 2015-09-24 ENCOUNTER — Encounter: Payer: Self-pay | Admitting: Internal Medicine

## 2015-09-24 VITALS — BP 158/102 | HR 82 | Temp 98.0°F | Ht 71.0 in | Wt 208.0 lb

## 2015-09-24 DIAGNOSIS — N182 Chronic kidney disease, stage 2 (mild): Secondary | ICD-10-CM

## 2015-09-24 DIAGNOSIS — Z720 Tobacco use: Secondary | ICD-10-CM | POA: Diagnosis not present

## 2015-09-24 DIAGNOSIS — Z113 Encounter for screening for infections with a predominantly sexual mode of transmission: Secondary | ICD-10-CM

## 2015-09-24 DIAGNOSIS — M779 Enthesopathy, unspecified: Secondary | ICD-10-CM

## 2015-09-24 DIAGNOSIS — B20 Human immunodeficiency virus [HIV] disease: Secondary | ICD-10-CM

## 2015-09-24 DIAGNOSIS — N189 Chronic kidney disease, unspecified: Secondary | ICD-10-CM | POA: Diagnosis not present

## 2015-09-24 DIAGNOSIS — Z79899 Other long term (current) drug therapy: Secondary | ICD-10-CM | POA: Diagnosis not present

## 2015-09-24 NOTE — Assessment & Plan Note (Signed)
Doing great with this.  RTC 6 months.

## 2015-09-24 NOTE — Assessment & Plan Note (Addendum)
Not sure what is causing the pain. I will try sports medicine.   I also signed a handicap placard for temporary use for 6 months.  I told him it is temporary and if he needs it again after 6 months he will need his PCP or someone else do it based on the diagnosis.

## 2015-09-24 NOTE — Progress Notes (Signed)
CC: Follow up for HIV  Interval history: Currently is asymptomatic and well-controlled on Tivicay and Descovy. Changed to Descovy last visit. Also has not seen his PCP in more than 1 year.  Is out of his BP meds.  He has greatly decreased his smoking.  Has no associated n/v.  He denies any missed doses.     Also has HTN and renal insufficiency.    Prior to Admission medications   Medication Sig Start Date End Date Taking? Authorizing Provider  amLODipine (NORVASC) 5 MG tablet TAKE 1 TABLET BY MOUTH EVERY DAY 06/20/14   Altha HarmMichelle A Matthews, MD  aspirin 81 MG tablet Take 1 tablet (81 mg total) by mouth daily. 06/11/13   Gardiner Barefootobert W Comer, MD  atenolol (TENORMIN) 100 MG tablet TAKE 1 TABLET BY MOUTH EVERY DAY 05/23/14   Gardiner Barefootobert W Comer, MD  dolutegravir (TIVICAY) 50 MG tablet Take 1 tablet (50 mg total) by mouth daily. 04/16/15   Gardiner Barefootobert W Comer, MD  emtricitabine-tenofovir (TRUVADA) 200-300 MG tablet Take 1 tablet by mouth daily. 04/16/15   Gardiner Barefootobert W Comer, MD  enalapril (VASOTEC) 20 MG tablet TAKE 1 TABLET(20 MG) BY MOUTH DAILY 02/25/15   Massie MaroonLachina M Hollis, FNP    Review of Systems Constitutional: negative for headache Gastrointestinal: negative for diarrhea Musculoskeletal: negative for myalgias and arthralgias All other systems reviewed and are negative   Physical Exam: CONSTITUTIONAL:in no apparent distress and alert  Filed Vitals:   09/24/15 0905 09/24/15 0910  BP:  158/102  Pulse:  82  Temp: 98 F (36.7 C)    Eyes: anicteric HENT: no thrush, no cervical lymphadenopathy Respiratory: Normal respiratory effort; CTA B Foot: some tenderness over achilles   Lab Results  Component Value Date   HIV1RNAQUANT <20 09/10/2015   HIV1RNAQUANT <20 05/15/2015   HIV1RNAQUANT 33* 06/10/2014   No components found for: HIV1GENOTYPRPLUS No components found for: THELPERCELL

## 2015-09-24 NOTE — Assessment & Plan Note (Signed)
Quitting and down to just 2 black and mild weekly.

## 2015-09-24 NOTE — Assessment & Plan Note (Signed)
Stable now on TAF in Descovy.  Needs better bp control and going to see his PCP for that.

## 2015-10-23 ENCOUNTER — Ambulatory Visit: Payer: Medicare Other | Admitting: Family Medicine

## 2015-10-28 ENCOUNTER — Ambulatory Visit (INDEPENDENT_AMBULATORY_CARE_PROVIDER_SITE_OTHER): Payer: Medicare Other | Admitting: Family Medicine

## 2015-10-28 ENCOUNTER — Other Ambulatory Visit: Payer: Self-pay | Admitting: Family Medicine

## 2015-10-28 ENCOUNTER — Encounter: Payer: Self-pay | Admitting: Family Medicine

## 2015-10-28 VITALS — BP 150/92 | HR 102 | Ht 71.0 in | Wt 208.0 lb

## 2015-10-28 DIAGNOSIS — I10 Essential (primary) hypertension: Secondary | ICD-10-CM | POA: Diagnosis not present

## 2015-10-28 MED ORDER — ENALAPRIL MALEATE 20 MG PO TABS: ORAL_TABLET | ORAL | Status: DC

## 2015-10-28 NOTE — Progress Notes (Signed)
  Chad Moses - 55 y.o. male MRN [id number]  Date of birth: 06/20/1960 Chad Moses is a 55 y.o. male who presents today for high blood pressure.  High blood pressure - Pt with known hx of HTN.  He p/w ongoing high blood pressure and not taking his atenolol or ACE due to running out of the medication.  Denies SOB/CP/HA/diplopia/Blurred vision/weakness or sensation changes.    PMHx - Updated and reviewed.  Contributory factors include: HTN, HIV PSHx - Updated and reviewed.  Contributory factors include:  Appendectomy  FHx - Updated and reviewed.  Contributory factors include:  CA Brother  Social Hx - Updated and reviewed. Contributory factors include: Smoker  Medications - Updated/reviewed    ROS Per HPI.  12 point negative other than per HPI.   Exam:  Filed Vitals:   10/28/15 1521  BP: 167/98  Pulse: 120   Gen: NAD, AAO 3 Cardio- Tachycardic, no murmur appreciate.  Regular rhythm  Pulm - Normal respiratory effort/rate Skin: No rashes or erythema Extremities: No edema  Vascular: pulses +2 bilateral upper and lower extremity Psych: Normal affect  MSK : No ankle TTP or effusion.

## 2015-10-28 NOTE — Assessment & Plan Note (Addendum)
Refill Vasotec today.  F/U with PCP in 2 weeks for recheck and lab work.  BP on repeat today is 150/92 and pulse is 102.  ASx at this time.

## 2015-11-13 ENCOUNTER — Ambulatory Visit: Payer: Medicare Other | Admitting: Family Medicine

## 2015-11-30 ENCOUNTER — Other Ambulatory Visit: Payer: Self-pay | Admitting: Internal Medicine

## 2015-12-21 ENCOUNTER — Encounter: Payer: Self-pay | Admitting: Family Medicine

## 2015-12-21 ENCOUNTER — Ambulatory Visit (INDEPENDENT_AMBULATORY_CARE_PROVIDER_SITE_OTHER): Payer: Medicare Other | Admitting: Family Medicine

## 2015-12-21 VITALS — BP 148/89 | HR 97 | Temp 98.6°F | Resp 14 | Ht 71.0 in | Wt 203.0 lb

## 2015-12-21 DIAGNOSIS — M10041 Idiopathic gout, right hand: Secondary | ICD-10-CM

## 2015-12-21 DIAGNOSIS — I10 Essential (primary) hypertension: Secondary | ICD-10-CM

## 2015-12-21 DIAGNOSIS — E138 Other specified diabetes mellitus with unspecified complications: Secondary | ICD-10-CM

## 2015-12-21 DIAGNOSIS — Z1329 Encounter for screening for other suspected endocrine disorder: Secondary | ICD-10-CM

## 2015-12-21 DIAGNOSIS — M109 Gout, unspecified: Secondary | ICD-10-CM

## 2015-12-21 LAB — CBC WITH DIFFERENTIAL/PLATELET
Basophils Absolute: 77 cells/uL (ref 0–200)
Basophils Relative: 1 %
Eosinophils Absolute: 154 cells/uL (ref 15–500)
Eosinophils Relative: 2 %
HEMATOCRIT: 39.5 % (ref 38.5–50.0)
Hemoglobin: 13.1 g/dL — ABNORMAL LOW (ref 13.2–17.1)
LYMPHS PCT: 24 %
Lymphs Abs: 1848 cells/uL (ref 850–3900)
MCH: 27.9 pg (ref 27.0–33.0)
MCHC: 33.2 g/dL (ref 32.0–36.0)
MCV: 84.2 fL (ref 80.0–100.0)
MONO ABS: 770 {cells}/uL (ref 200–950)
MPV: 9.6 fL (ref 7.5–12.5)
Monocytes Relative: 10 %
NEUTROS PCT: 63 %
Neutro Abs: 4851 cells/uL (ref 1500–7800)
Platelets: 289 10*3/uL (ref 140–400)
RBC: 4.69 MIL/uL (ref 4.20–5.80)
RDW: 15.5 % — AB (ref 11.0–15.0)
WBC: 7.7 10*3/uL (ref 3.8–10.8)

## 2015-12-21 MED ORDER — COLCHICINE 0.6 MG PO TABS: ORAL_TABLET | ORAL | Status: DC

## 2015-12-21 MED ORDER — ENALAPRIL MALEATE 20 MG PO TABS: 20.0000 mg | ORAL_TABLET | Freq: Every day | ORAL | Status: DC

## 2015-12-21 MED ORDER — ATENOLOL 100 MG PO TABS: 100.0000 mg | ORAL_TABLET | Freq: Every day | ORAL | Status: DC

## 2015-12-21 NOTE — Patient Instructions (Signed)
Will let you know if anything further needs to be done after we get labs.

## 2015-12-21 NOTE — Progress Notes (Signed)
Patient ID: Chad Jackimothy Gasca, male   DOB: 10/05/1960, 55 y.o.   MRN: [id number]   Chad Moses, is a 55 y.o. male  [id number]  [id number]  DOB - 01/25/1961  CC:  Chief Complaint  Patient presents with  . Gout    in right hand   . Follow-up  . Hypertension       HPI: Chad Jackimothy Paccione is a 55 y.o. male here complaining of gout in his right hand and wrist. He has had gout in his area before.  He has a history of a HBGA1C of 7 in Jan 2016 but is not on medication for diabetes. He has not been seen here in over 1 1/2 years. His pat medical history consist of hypertension, HIV, depression, cortical visual impairment, CVA, diabetes and hyperlipidemia. He reports having a dilated eye exam in January and a colonscopy in 2016. He is no being treated for diabetes or cholesterol.    No Known Allergies Past Medical History  Diagnosis Date  . Hypertension   . HIV disease (HCC)   . Depression   . Cortical visual impairment 2002  . Gout   . Stroke (HCC) 2002  . Blood transfusion without reported diagnosis   . Diabetes mellitus without complication (HCC)   . Hyperlipidemia    Current Outpatient Prescriptions on File Prior to Visit  Medication Sig Dispense Refill  . aspirin 81 MG tablet Take 1 tablet (81 mg total) by mouth daily. 30 tablet 5  . DESCOVY 200-25 MG tablet TAKE 1 TABLET BY MOUTH DAILY 30 tablet 4  . dolutegravir (TIVICAY) 50 MG tablet Take 1 tablet (50 mg total) by mouth daily. 30 tablet 5  . triamcinolone ointment (KENALOG) 0.1 % Apply topically 2 (two) times daily. (Patient not taking: Reported on 12/21/2015) 80 g 1   No current facility-administered medications on file prior to visit.   Family History  Problem Relation Age of Onset  . Diabetes Mother   . Hypertension Mother   . Stroke Brother   . Cancer Brother   . Colon cancer Neg Hx   . Esophageal cancer Neg Hx   . Rectal cancer Neg Hx   . Stomach cancer Neg Hx    Social History   Social History  . Marital  Status: Divorced    Spouse Name: N/A  . Number of Children: N/A  . Years of Education: N/A   Occupational History  . Not on file.   Social History Main Topics  . Smoking status: Former Smoker -- 0.00 packs/day    Types: Cigarettes, Cigars  . Smokeless tobacco: Never Used     Comment: given quitline info  . Alcohol Use: No  . Drug Use: No  . Sexual Activity:    Partners: Female, Male     Comment: pt. given condoms   Other Topics Concern  . Not on file   Social History Narrative    Review of Systems: Constitutional: Negative for fever, chills, appetite change, weight loss,  Fatigue. Skin: Negative for rashes or lesions of concern. HENT: Negative for ear pain, ear discharge.nose bleeds Eyes: Negative for pain, discharge, redness, itching and visual disturbance. Neck: Negative for pain, stiffness Respiratory: Negative for cough, shortness of breath,   Cardiovascular: Negative for chest pain, palpitations and leg swelling. Gastrointestinal: Negative for abdominal pain, nausea, vomiting, diarrhea, constipations Genitourinary: Negative for dysuria, urgency, frequency, hematuria,  Musculoskeletal: Negative for back pain, joint pain, joint  swelling, and gait problem.Negative for weakness.Positive for right hand pain Neurological:  Negative for dizziness, tremors, seizures, syncope,   light-headedness, numbness and headaches.  Hematological: Negative for easy bruising or bleeding Psychiatric/Behavioral: Negative for depression, anxiety, decreased concentration, confusion   Objective:   Filed Vitals:   12/21/15 1007  BP: 148/89  Pulse: 97  Temp: 98.6 F (37 C)  Resp: 14    Physical Exam: Constitutional: Patient appears well-developed and well-nourished. No distress. HENT: Normocephalic, atraumatic, External right and left ear normal. Oropharynx is clear and moist.  Eyes: Conjunctivae and EOM are normal. PERRLA, no scleral icterus. Neck: Normal ROM. Neck supple. No  lymphadenopathy, No thyromegaly. CVS: RRR, S1/S2 +, no murmurs, no gallops, no rubs Pulmonary: Effort and breath sounds normal, no stridor, rhonchi, wheezes, rales.  Abdominal: Soft. Normoactive BS,, no distension, tenderness, rebound or guarding.  Musculoskeletal: Normal range of motion. Minor swelling and tenderness of right hand Neuro: Alert.Normal muscle tone coordination. Non-focal Skin: Skin is warm and dry. No rash noted. Not diaphoretic. No erythema. No pallor. Psychiatric: Normal mood and affect. Behavior, judgment, thought content normal.  Lab Results  Component Value Date   WBC 7.7 12/21/2015   HGB 13.1* 12/21/2015   HCT 39.5 12/21/2015   MCV 84.2 12/21/2015   PLT 289 12/21/2015   Lab Results  Component Value Date   CREATININE 1.65* 12/21/2015   BUN 17 12/21/2015   NA 141 12/21/2015   K 4.3 12/21/2015   CL 104 12/21/2015   CO2 21 12/21/2015    Lab Results  Component Value Date   HGBA1C 7.4* 12/21/2015   Lipid Panel     Component Value Date/Time   CHOL 207* 12/21/2015 1047   TRIG 106 12/21/2015 1047   HDL 31* 12/21/2015 1047   CHOLHDL 6.7* 12/21/2015 1047   VLDL 21 12/21/2015 1047   LDLCALC 155* 12/21/2015 1047       Assessment and plan:   1. Essential hypertension  - atenolol (TENORMIN) 100 MG tablet; Take 1 tablet (100 mg total) by mouth daily.  Dispense: 30 tablet; Refill: 0 - enalapril (VASOTEC) 20 MG tablet; Take 1 tablet (20 mg total) by mouth daily.  Dispense: 90 tablet; Refill: 0  2. Diabetes mellitus of other type with complication (HCC)  - Hemoglobin A1c - COMPLETE METABOLIC PANEL WITH GFR - CBC with Differential - Lipid panel - Ambulatory referral to Ophthalmology    3. Gout of right hand, unspecified cause, unspecified chronicity  - colchicine 0.6 MG tablet; Take 2 day one. Then one one hour later.  Dispense: 3 tablet; Refill: 0 - Uric Acid   Return in about 3 months (around 03/22/2016).  The patient was given clear  instructions to go to ER or return to medical center if symptoms don't improve, worsen or new problems develop. The patient verbalized understanding.    Henrietta Hoover FNP  12/22/2015, 8:03 AM

## 2015-12-22 ENCOUNTER — Ambulatory Visit (INDEPENDENT_AMBULATORY_CARE_PROVIDER_SITE_OTHER): Payer: Medicare Other

## 2015-12-22 ENCOUNTER — Encounter (HOSPITAL_COMMUNITY): Payer: Self-pay | Admitting: Emergency Medicine

## 2015-12-22 ENCOUNTER — Ambulatory Visit (HOSPITAL_COMMUNITY)
Admission: EM | Admit: 2015-12-22 | Discharge: 2015-12-22 | Disposition: A | Discharge status: Home / Self Care | Payer: Medicare Other | Attending: Family Medicine | Admitting: Family Medicine

## 2015-12-22 DIAGNOSIS — M79641 Pain in right hand: Secondary | ICD-10-CM | POA: Diagnosis not present

## 2015-12-22 DIAGNOSIS — R109 Unspecified abdominal pain: Secondary | ICD-10-CM

## 2015-12-22 LAB — COMPLETE METABOLIC PANEL WITH GFR
ALT: 17 U/L (ref 9–46)
AST: 15 U/L (ref 10–35)
Albumin: 3.8 g/dL (ref 3.6–5.1)
Alkaline Phosphatase: 98 U/L (ref 40–115)
BILIRUBIN TOTAL: 0.4 mg/dL (ref 0.2–1.2)
BUN: 17 mg/dL (ref 7–25)
CALCIUM: 9.4 mg/dL (ref 8.6–10.3)
CHLORIDE: 104 mmol/L (ref 98–110)
CO2: 21 mmol/L (ref 20–31)
CREATININE: 1.65 mg/dL — AB (ref 0.70–1.33)
GFR, Est African American: 54 mL/min — ABNORMAL LOW (ref >=60)
GFR, Est Non African American: 47 mL/min — ABNORMAL LOW (ref >=60)
Glucose, Bld: 128 mg/dL — ABNORMAL HIGH (ref 65–99)
Potassium: 4.3 mmol/L (ref 3.5–5.3)
Sodium: 141 mmol/L (ref 135–146)
TOTAL PROTEIN: 7.4 g/dL (ref 6.1–8.1)

## 2015-12-22 LAB — LIPID PANEL
CHOLESTEROL: 207 mg/dL — AB (ref 125–200)
HDL: 31 mg/dL — ABNORMAL LOW (ref >=40)
LDL Cholesterol: 155 mg/dL — ABNORMAL HIGH (ref <130)
Total CHOL/HDL Ratio: 6.7 Ratio — ABNORMAL HIGH (ref <=5.0)
Triglycerides: 106 mg/dL (ref <150)
VLDL: 21 mg/dL (ref <30)

## 2015-12-22 LAB — URIC ACID: URIC ACID, SERUM: 8.2 mg/dL — AB (ref 4.0–8.0)

## 2015-12-22 LAB — HEMOGLOBIN A1C
HEMOGLOBIN A1C: 7.4 % — AB (ref <5.7)
MEAN PLASMA GLUCOSE: 166 mg/dL

## 2015-12-22 MED ORDER — HYDROCODONE-ACETAMINOPHEN 10-325 MG PO TABS: 1.0000 | ORAL_TABLET | Freq: Four times a day (QID) | ORAL | Status: DC | PRN

## 2015-12-22 MED ORDER — ONDANSETRON HCL 4 MG PO TABS: 4.0000 mg | ORAL_TABLET | Freq: Four times a day (QID) | ORAL | Status: DC

## 2015-12-22 MED ORDER — PREDNISONE 10 MG PO TABS: 20.0000 mg | ORAL_TABLET | Freq: Every day | ORAL | Status: DC

## 2015-12-22 NOTE — Discharge Instructions (Signed)

## 2015-12-22 NOTE — ED Provider Notes (Signed)
CSN: [id number]     Arrival date & time 12/22/15  1000 History   First MD Initiated Contact with Patient 12/22/15 1056     Chief Complaint  Patient presents with  . Abdominal Pain  . Hand Pain   (Consider location/radiation/quality/duration/timing/severity/associated sxs/prior Treatment) HPI History obtained from patient:  Pt presents with the cc of:  General abdominal pain Duration of symptoms: Yesterday afternoon Treatment prior to arrival: No treatment Context: Patient states that he had just finished eating lunch and had tossed salad with a dressing. He states that shortly after that his stomach started hurting. Pain did get better after a while attempted to use the bathroom but was unable to have bowel movement. Has had no trouble with urination. Has not had any fever or vomiting has had some nausea. Other symptoms include: Recently was seen by a primary care provider for right hand pain and was diagnosed with gout and given colchicine. He states that the pain is no better at this time. Pain score: 6 FAMILY HISTORY: Hypertension    Past Medical History  Diagnosis Date  . Hypertension   . HIV disease (HCC)   . Depression   . Cortical visual impairment 2002  . Gout   . Stroke (HCC) 2002  . Blood transfusion without reported diagnosis   . Diabetes mellitus without complication (HCC)   . Hyperlipidemia    Past Surgical History  Procedure Laterality Date  . Appendectomy    . Tonsillectomy     Family History  Problem Relation Age of Onset  . Diabetes Mother   . Hypertension Mother   . Stroke Brother   . Cancer Brother   . Colon cancer Neg Hx   . Esophageal cancer Neg Hx   . Rectal cancer Neg Hx   . Stomach cancer Neg Hx    Social History  Substance Use Topics  . Smoking status: Former Smoker -- 0.00 packs/day    Types: Cigarettes, Cigars  . Smokeless tobacco: Never Used     Comment: given quitline info  . Alcohol Use: No    Review of Systems  Denies:  HEADACHE, NAUSEA,  CHEST PAIN, CONGESTION, DYSURIA, SHORTNESS OF BREATH  Allergies  Review of patient's allergies indicates no known allergies.  Home Medications   Prior to Admission medications   Medication Sig Start Date End Date Taking? Authorizing Provider  aspirin 81 MG tablet Take 1 tablet (81 mg total) by mouth daily. 06/11/13   Gardiner Barefootobert W Comer, MD  atenolol (TENORMIN) 100 MG tablet Take 1 tablet (100 mg total) by mouth daily. 12/21/15   Henrietta HooverLinda C Bernhardt, NP  colchicine 0.6 MG tablet Take 2 day one. Then one one hour later. 12/21/15   Henrietta HooverLinda C Bernhardt, NP  DESCOVY 200-25 MG tablet TAKE 1 TABLET BY MOUTH DAILY 11/30/15   Gardiner Barefootobert W Comer, MD  dolutegravir (TIVICAY) 50 MG tablet Take 1 tablet (50 mg total) by mouth daily. 05/26/15   Gardiner Barefootobert W Comer, MD  enalapril (VASOTEC) 20 MG tablet Take 1 tablet (20 mg total) by mouth daily. 12/21/15   Henrietta HooverLinda C Bernhardt, NP  HYDROcodone-acetaminophen (NORCO) 10-325 MG tablet Take 1 tablet by mouth every 6 (six) hours as needed. 12/22/15   Tharon AquasFrank C Jodine Muchmore, PA  ondansetron (ZOFRAN) 4 MG tablet Take 1 tablet (4 mg total) by mouth every 6 (six) hours. 12/22/15   Tharon AquasFrank C Miryam Mcelhinney, PA  predniSONE (DELTASONE) 10 MG tablet Take 2 tablets (20 mg total) by mouth daily. 12/22/15   Hortencia PilarFrank C  Luisa Hart, PA  triamcinolone ointment (KENALOG) 0.1 % Apply topically 2 (two) times daily. Patient not taking: Reported on 12/21/2015 05/26/15   Gardiner Barefoot, MD   Meds Ordered and Administered this Visit  Medications - No data to display  BP 165/98 mmHg  Pulse 107  Temp(Src) 98.6 F (37 C) (Oral)  Resp 16  SpO2 99% No data found.   Physical Exam NURSES NOTES AND VITAL SIGNS REVIEWED. CONSTITUTIONAL: Well developed, well nourished, no acute distress HEENT: normocephalic, atraumatic EYES: Conjunctiva normal NECK:normal ROM, supple, no adenopathy PULMONARY:No respiratory distress, normal effort ABDOMINAL: Soft, ND, diffusely tender with decreased bowel sounds. No guarding and  no rebound. No CVAT MUSCULOSKELETAL: Normal ROM of all extremities,  SKIN: warm and dry without rash PSYCHIATRIC: Mood and affect, behavior are normal  ED Course  Procedures (including critical care time)  Labs Review Labs Reviewed - No data to display  Imaging Review Dg Abd 2 Views  12/22/2015  CLINICAL DATA:  Abdominal pain 2 days over the mid to lower abdomen extending to the left lower quadrant. EXAM: ABDOMEN - 2 VIEW COMPARISON:  Pelvis 01/03/2012 FINDINGS: Bowel gas pattern is nonobstructive with mild fecal retention over the right colon. No free peritoneal air. Few air-fluid levels over the right colon. No mass or mass effect. Multiple phleboliths over the pelvis. Minimal degenerative change of the spine and hips. IMPRESSION: Nonobstructive bowel gas pattern. Electronically Signed   By: Elberta Fortis M.D.   On: 12/22/2015 12:02   Discussed with patient prior to discharge.  Visual Acuity Review  Right Eye Distance:   Left Eye Distance:   Bilateral Distance:    Right Eye Near:   Left Eye Near:    Bilateral Near:      At this point in time patient is well and has no acute changes on his abdominal series. Patient has had no vomiting here in the department. Pain is a bit better by his report. I see no indication that the patient needs emergent imaging other than plain films of the abdomen done does not require emergency department visit at this time. He is advised however that if pain persists or he does get worse she should go to the emergency department.   MDM   1. Abdominal pain, unspecified abdominal location   2. Hand pain, right     Patient is reassured that there are no issues that require transfer to higher level of care at this time or additional tests. Patient is advised to continue home symptomatic treatment. Patient is advised that if there are new or worsening symptoms to attend the emergency department, contact primary care provider, or return to  [facility]. Instructions of care provided discharged home in stable condition.    THIS NOTE WAS GENERATED USING A VOICE RECOGNITION SOFTWARE PROGRAM. ALL REASONABLE EFFORTS  WERE MADE TO PROOFREAD THIS DOCUMENT FOR ACCURACY.  I have verbally reviewed the discharge instructions with the patient. A printed AVS was given to the patient.  All questions were answered prior to discharge.      Tharon Aquas, PA 12/22/15 [phone]

## 2015-12-22 NOTE — ED Notes (Signed)
PT reports right hand pain for 2 weeks that he believes is a gout flare up. PT reports severe lower abdominal pain that started yesterday 1 hour after he consumed a salad. PT reports that is the only thing he ate yesterday. PT reports he washed the contents thoroughly. PT does note that the salad dressing seemed watery and he is unsure of expiration date. No vomiting or diarrhea. PT reports a normal BM before eating yest. PT denies any abdominal history. PT reports he is nauseated and his abdomen hurts worse when he urinates.

## 2015-12-24 ENCOUNTER — Other Ambulatory Visit: Payer: Self-pay | Admitting: Family Medicine

## 2015-12-24 MED ORDER — METFORMIN HCL 500 MG PO TABS: 500.0000 mg | ORAL_TABLET | Freq: Two times a day (BID) | ORAL | Status: DC

## 2015-12-24 MED ORDER — PRAVASTATIN SODIUM 40 MG PO TABS: 40.0000 mg | ORAL_TABLET | Freq: Every day | ORAL | Status: DC

## 2016-03-21 ENCOUNTER — Other Ambulatory Visit: Payer: Self-pay

## 2016-03-21 MED ORDER — PRAVASTATIN SODIUM 40 MG PO TABS: 40.0000 mg | ORAL_TABLET | Freq: Every day | ORAL | 0 refills | Status: DC

## 2016-03-21 MED ORDER — METFORMIN HCL 500 MG PO TABS: 500.0000 mg | ORAL_TABLET | Freq: Two times a day (BID) | ORAL | 0 refills | Status: DC

## 2016-03-21 NOTE — Telephone Encounter (Signed)
Refills for pravastatin and metformin sent into pharmacy. Thanks!

## 2016-04-14 ENCOUNTER — Other Ambulatory Visit: Payer: Self-pay | Admitting: Internal Medicine

## 2016-04-14 DIAGNOSIS — B20 Human immunodeficiency virus [HIV] disease: Secondary | ICD-10-CM

## 2016-04-26 ENCOUNTER — Encounter (HOSPITAL_COMMUNITY): Payer: Self-pay | Admitting: Emergency Medicine

## 2016-04-26 ENCOUNTER — Ambulatory Visit (HOSPITAL_COMMUNITY)
Admission: EM | Admit: 2016-04-26 | Discharge: 2016-04-26 | Disposition: A | Discharge status: Home / Self Care | Payer: Medicare Other | Attending: Family Medicine | Admitting: Family Medicine

## 2016-04-26 DIAGNOSIS — B353 Tinea pedis: Secondary | ICD-10-CM

## 2016-04-26 DIAGNOSIS — M65832 Other synovitis and tenosynovitis, left forearm: Secondary | ICD-10-CM | POA: Diagnosis not present

## 2016-04-26 MED ORDER — KETOCONAZOLE 2 % EX CREA: 1.0000 "application " | TOPICAL_CREAM | Freq: Two times a day (BID) | CUTANEOUS | 0 refills | Status: DC

## 2016-04-26 MED ORDER — NAPROXEN 375 MG PO TABS: 375.0000 mg | ORAL_TABLET | Freq: Two times a day (BID) | ORAL | 0 refills | Status: DC

## 2016-04-26 NOTE — ED Provider Notes (Signed)
CSN: [id number]     Arrival date & time 04/26/16  1003 History   First MD Initiated Contact with Patient 04/26/16 1043     Chief Complaint  Patient presents with  . Arm Pain  . Foot Pain   (Consider location/radiation/quality/duration/timing/severity/associated sxs/prior Treatment) Patient is here today with c/o left arm and elbow discomfort.  He is c/o rash on his right foot.     The history is provided by the patient.  Arm Pain  This is a new problem. The problem occurs constantly. The problem has not changed since onset.Nothing aggravates the symptoms. Nothing relieves the symptoms. He has tried nothing for the symptoms.  Foot Pain     Past Medical History:  Diagnosis Date  . Blood transfusion without reported diagnosis   . Cortical visual impairment 2002  . Depression   . Diabetes mellitus without complication (HCC)   . Gout   . HIV disease (HCC)   . Hyperlipidemia   . Hypertension   . Stroke [facility](HCC) 2002   Past Surgical History:  Procedure Laterality Date  . APPENDECTOMY    . TONSILLECTOMY     Family History  Problem Relation Age of Onset  . Diabetes Mother   . Hypertension Mother   . Stroke Brother   . Cancer Brother   . Colon cancer Neg Hx   . Esophageal cancer Neg Hx   . Rectal cancer Neg Hx   . Stomach cancer Neg Hx    Social History  Substance Use Topics  . Smoking status: Former Smoker    Packs/day: 0.00    Types: Cigarettes, Cigars  . Smokeless tobacco: Never Used     Comment: given quitline info  . Alcohol use No    Review of Systems  Constitutional: Negative.   HENT: Negative.   Eyes: Negative.   Respiratory: Negative.   Cardiovascular: Negative.   Gastrointestinal: Negative.   Endocrine: Negative.   Genitourinary: Negative.   Musculoskeletal: Positive for arthralgias and joint swelling.  Skin: Positive for rash.  Allergic/Immunologic: Negative.   Neurological: Negative.   Hematological: Negative.   Psychiatric/Behavioral: Negative.      Allergies  Patient has no known allergies.  Home Medications   Prior to Admission medications   Medication Sig Start Date End Date Taking? Authorizing Provider  aspirin 81 MG tablet Take 1 tablet (81 mg total) by mouth daily. 06/11/13  Yes Gardiner Barefootobert W Comer, MD  atenolol (TENORMIN) 100 MG tablet Take 1 tablet (100 mg total) by mouth daily. 12/21/15  Yes Henrietta HooverLinda C Bernhardt, NP  DESCOVY 200-25 MG tablet TAKE 1 TABLET BY MOUTH DAILY 04/14/16  Yes Gardiner Barefootobert W Comer, MD  enalapril (VASOTEC) 20 MG tablet Take 1 tablet (20 mg total) by mouth daily. 12/21/15  Yes Henrietta HooverLinda C Bernhardt, NP  metFORMIN (GLUCOPHAGE) 500 MG tablet Take 1 tablet (500 mg total) by mouth 2 (two) times daily with a meal. 03/21/16  Yes Henrietta HooverLinda C Bernhardt, NP  pravastatin (PRAVACHOL) 40 MG tablet Take 1 tablet (40 mg total) by mouth daily. 03/21/16  Yes Henrietta HooverLinda C Bernhardt, NP  colchicine 0.6 MG tablet Take 2 day one. Then one one hour later. 12/21/15   Henrietta HooverLinda C Bernhardt, NP  dolutegravir (TIVICAY) 50 MG tablet Take 1 tablet (50 mg total) by mouth daily. 05/26/15   Gardiner Barefootobert W Comer, MD  HYDROcodone-acetaminophen [facility](NORCO) 10-325 MG tablet Take 1 tablet by mouth every 6 (six) hours as needed. 12/22/15   Tharon AquasFrank C Patrick, PA  naproxen (NAPROSYN) 375 MG tablet  Take 1 tablet (375 mg total) by mouth 2 (two) times daily. 04/26/16   Deatra CanterWilliam J Gizell Danser, FNP  ondansetron (ZOFRAN) 4 MG tablet Take 1 tablet (4 mg total) by mouth every 6 (six) hours. 12/22/15   Tharon AquasFrank C Patrick, PA  predniSONE (DELTASONE) 10 MG tablet Take 2 tablets (20 mg total) by mouth daily. 12/22/15   Tharon AquasFrank C Patrick, PA  triamcinolone ointment (KENALOG) 0.1 % Apply topically 2 (two) times daily. Patient not taking: Reported on 12/21/2015 05/26/15   Gardiner Barefootobert W Comer, MD   Meds Ordered and Administered this Visit  Medications - No data to display  BP 137/87 (BP Location: Right Arm)   Pulse 89   Temp 98 F (36.7 C) (Oral)   Resp 18   SpO2 98%  No data found.   Physical Exam   Constitutional: He appears well-developed and well-nourished.  HENT:  Head: Normocephalic.  Eyes: EOM are normal. Pupils are equal, round, and reactive to light.  Neck: Normal range of motion. Neck supple.  Cardiovascular: Normal rate, regular rhythm and normal heart sounds.   Pulmonary/Chest: Effort normal and breath sounds normal.  Musculoskeletal: He exhibits tenderness.  TTP left forearm and thumb.  Positive [place]ink[place].  Skin: Rash noted.  Right foot with tinea pedis.  Nursing note and vitals reviewed.   Urgent Care Course   Clinical Course     Procedures (including critical care time)  Labs Review Labs Reviewed - No data to display  Imaging Review No results found.   Visual Acuity Review  Right Eye Distance:   Left Eye Distance:   Bilateral Distance:    Right Eye Near:   Left Eye Near:    Bilateral Near:         MDM   1. Other synovitis and tenosynovitis, left forearm   Naprosyn 500mg  one po bid x 10 days #20  2. Tinea Pedis Ketoconazole 2% cream apply bid #60 grams      Deatra CanterWilliam J Bianco Cange, FNP 04/26/16 1154

## 2016-04-26 NOTE — ED Triage Notes (Signed)
[facility] a [facility]. The patient reported that he is having swelling in his left forearm and hand as well as his right foot. The patient denied any type of known injury to either.

## 2016-06-14 ENCOUNTER — Other Ambulatory Visit: Payer: Self-pay

## 2016-06-14 DIAGNOSIS — I10 Essential (primary) hypertension: Secondary | ICD-10-CM

## 2016-06-14 MED ORDER — ATENOLOL 100 MG PO TABS: 100.0000 mg | ORAL_TABLET | Freq: Every day | ORAL | 0 refills | Status: DC

## 2016-06-14 NOTE — Telephone Encounter (Signed)
Refill for Atenolol sent into [facility]. Thanks!

## 2016-06-16 ENCOUNTER — Other Ambulatory Visit: Payer: Self-pay

## 2016-06-16 MED ORDER — METFORMIN HCL 500 MG PO TABS: 500.0000 mg | ORAL_TABLET | Freq: Two times a day (BID) | ORAL | 0 refills | Status: DC

## 2016-06-16 MED ORDER — PRAVASTATIN SODIUM 40 MG PO TABS: 40.0000 mg | ORAL_TABLET | Freq: Every day | ORAL | 0 refills | Status: DC

## 2016-06-16 NOTE — Telephone Encounter (Signed)
Refilled metformin and pravastatin for 1 month. Called and left message, patient needs to schedule another appointment before any refills are given. Thanks!

## 2016-07-04 ENCOUNTER — Ambulatory Visit (INDEPENDENT_AMBULATORY_CARE_PROVIDER_SITE_OTHER): Payer: Medicare Other | Admitting: Family Medicine

## 2016-07-04 ENCOUNTER — Encounter: Payer: Self-pay | Admitting: Family Medicine

## 2016-07-04 VITALS — BP 145/84 | HR 83 | Temp 98.0°F | Resp 12 | Ht 71.0 in | Wt 198.0 lb

## 2016-07-04 DIAGNOSIS — M10072 Idiopathic gout, left ankle and foot: Secondary | ICD-10-CM

## 2016-07-04 DIAGNOSIS — E119 Type 2 diabetes mellitus without complications: Secondary | ICD-10-CM | POA: Diagnosis not present

## 2016-07-04 LAB — CBC WITH DIFFERENTIAL/PLATELET
BASOS ABS: 65 {cells}/uL (ref 0–200)
Basophils Relative: 1 %
EOS ABS: 325 {cells}/uL (ref 15–500)
EOS PCT: 5 %
HCT: 42.5 % (ref 38.5–50.0)
Hemoglobin: 14 g/dL (ref 13.2–17.1)
LYMPHS ABS: 2210 {cells}/uL (ref 850–3900)
Lymphocytes Relative: 34 %
MCH: 27.9 pg (ref 27.0–33.0)
MCHC: 32.9 g/dL (ref 32.0–36.0)
MCV: 84.7 fL (ref 80.0–100.0)
MONO ABS: 520 {cells}/uL (ref 200–950)
MPV: 9.7 fL (ref 7.5–12.5)
Monocytes Relative: 8 %
NEUTROS ABS: 3380 {cells}/uL (ref 1500–7800)
NEUTROS PCT: 52 %
Platelets: 289 10*3/uL (ref 140–400)
RBC: 5.02 MIL/uL (ref 4.20–5.80)
RDW: 16.6 % — ABNORMAL HIGH (ref 11.0–15.0)
WBC: 6.5 10*3/uL (ref 3.8–10.8)

## 2016-07-04 LAB — POCT GLYCOSYLATED HEMOGLOBIN (HGB A1C): Hemoglobin A1C: 6.3

## 2016-07-04 MED ORDER — COLCHICINE 0.6 MG PO TABS: 0.6000 mg | ORAL_TABLET | Freq: Every day | ORAL | 3 refills | Status: DC

## 2016-07-04 MED ORDER — NAPROXEN 375 MG PO TABS: 375.0000 mg | ORAL_TABLET | Freq: Two times a day (BID) | ORAL | 0 refills | Status: DC

## 2016-07-04 MED ORDER — KETOCONAZOLE 2 % EX CREA: 1.0000 "application " | TOPICAL_CREAM | Freq: Two times a day (BID) | CUTANEOUS | 0 refills | Status: AC

## 2016-07-04 MED ORDER — TRIAMCINOLONE ACETONIDE 0.1 % EX OINT: TOPICAL_OINTMENT | Freq: Two times a day (BID) | CUTANEOUS | 1 refills | Status: DC

## 2016-07-04 NOTE — Patient Instructions (Signed)
Take naprosyn twice a day for 5-10 days. Take colchicine daily

## 2016-07-04 NOTE — Progress Notes (Signed)
Chad Moses, is a 56 y.o. male  WUJ:[id number]  NFA:[id number]  DOB - 01-19-1961  CC:  Chief Complaint  Patient presents with  . gout    would like rx for gout due to recent pain in foot       HPI: Chad Moses is a 56 y.o. male here for routine follow-up and for gout flare in his left ankle. He has a history of DM, HIV, hypertension, hyperlipidemia, CVA. Depression and visual impairment.   His only complaint if of left foot and ankle pain. This is the usual site of his gout. He reports he does not need refills on other medications today. He reports having a diabetic eye exam within the last 6 months. His last  A1C 6 months ago was 7.4. Is 6.3 today. His BP is 145/84.   Health Maintenance:  He reports having his flu shot this year.    No Known Allergies Past Medical History:  Diagnosis Date  . Blood transfusion without reported diagnosis   . Cortical visual impairment 2002  . Depression   . Diabetes mellitus without complication (HCC)   . Gout   . HIV disease (HCC)   . Hyperlipidemia   . Hypertension   . Stroke [facility]) 2002   Current Outpatient Prescriptions on File Prior to Visit  Medication Sig Dispense Refill  . aspirin 81 MG tablet Take 1 tablet (81 mg total) by mouth daily. 30 tablet 5  . atenolol (TENORMIN) 100 MG tablet Take 1 tablet (100 mg total) by mouth daily. 30 tablet 0  . DESCOVY 200-25 MG tablet TAKE 1 TABLET BY MOUTH DAILY 30 tablet 3  . enalapril (VASOTEC) 20 MG tablet Take 1 tablet (20 mg total) by mouth daily. 90 tablet 0  . metFORMIN (GLUCOPHAGE) 500 MG tablet Take 1 tablet (500 mg total) by mouth 2 (two) times daily with a meal. 60 tablet 0  . pravastatin (PRAVACHOL) 40 MG tablet Take 1 tablet (40 mg total) by mouth daily. 30 tablet 0  . dolutegravir (TIVICAY) 50 MG tablet Take 1 tablet (50 mg total) by mouth daily. (Patient not taking: Reported on 07/04/2016) 30 tablet 5  . HYDROcodone-acetaminophen (NORCO) 10-325 MG tablet Take 1 tablet by mouth  every 6 (six) hours as needed. (Patient not taking: Reported on 07/04/2016) 30 tablet 0  . ondansetron (ZOFRAN) 4 MG tablet Take 1 tablet (4 mg total) by mouth every 6 (six) hours. (Patient not taking: Reported on 07/04/2016) 12 tablet 0  . predniSONE (DELTASONE) 10 MG tablet Take 2 tablets (20 mg total) by mouth daily. (Patient not taking: Reported on 07/04/2016) 15 tablet 0   No current facility-administered medications on file prior to visit.    Family History  Problem Relation Age of Onset  . Diabetes Mother   . Hypertension Mother   . Stroke Brother   . Cancer Brother   . Colon cancer Neg Hx   . Esophageal cancer Neg Hx   . Rectal cancer Neg Hx   . Stomach cancer Neg Hx    Social History   Social History  . Marital status: Divorced    Spouse name: N/A  . Number of children: N/A  . Years of education: N/A   Occupational History  . Not on file.   Social History Main Topics  . Smoking status: Former Smoker    Packs/day: 0.00    Types: Cigarettes, Cigars  . Smokeless tobacco: Never Used     Comment: given quitline info  .  Alcohol use No  . Drug use: No  . Sexual activity: Not Currently    Partners: Female, Male     Comment: pt. given condoms   Other Topics Concern  . Not on file   Social History Narrative  . No narrative on file    Review of Systems: Constitutional: Negative Skin: + peeling feet, thick nails.  HENT: Negative  Eyes: Negative  Neck: Negative Respiratory: Negative Cardiovascular: Negative Gastrointestinal: Negative Genitourinary: Negative  Musculoskeletal: + for left foot and ankle pain Neurological: Negative for Hematological: Negative  Psychiatric/Behavioral: Negative    Objective:   Vitals:   07/04/16 1043  BP: (!) 145/84  Pulse: 83  Resp: 12  Temp: 98 F (36.7 C)    Physical Exam: Constitutional: Patient appears well-developed and well-nourished. No distress. HENT: Normocephalic, atraumatic, External right and left ear  normal. Oropharynx is clear and moist.  Eyes: Conjunctivae and EOM are normal. PERRLA, no scleral icterus. Neck: Normal ROM. Neck supple. No lymphadenopathy, No thyromegaly. CVS: RRR, S1/S2 +, no murmurs, no gallops, no rubs Pulmonary: Effort and breath sounds normal, no stridor, rhonchi, wheezes, rales.  Abdominal: Soft. Normoactive BS,, no distension, tenderness, rebound or guarding.  Musculoskeletal: Normal range of motion. No edema and no tenderness.  Neuro: Alert.Normal muscle tone coordination. Non-focal Skin: Skin is warm and dry. No rash noted. Not diaphoretic. No erythema. No pallor. Right foot is dry and scaley, nails on left thick Psychiatric: Normal mood and affect. Behavior, judgment, thought content normal.  Lab Results  Component Value Date   WBC 7.7 12/21/2015   HGB 13.1 (L) 12/21/2015   HCT 39.5 12/21/2015   MCV 84.2 12/21/2015   PLT 289 12/21/2015   Lab Results  Component Value Date   CREATININE 1.65 (H) 12/21/2015   BUN 17 12/21/2015   NA 141 12/21/2015   K 4.3 12/21/2015   CL 104 12/21/2015   CO2 21 12/21/2015    Lab Results  Component Value Date   HGBA1C 6.3 07/04/2016   Lipid Panel     Component Value Date/Time   CHOL 207 (H) 12/21/2015 1047   TRIG 106 12/21/2015 1047   HDL 31 (L) 12/21/2015 1047   CHOLHDL 6.7 (H) 12/21/2015 1047   VLDL 21 12/21/2015 1047   LDLCALC 155 (H) 12/21/2015 1047        Assessment and plan:   1. Controlled type 2 diabetes mellitus without complication, without long-term current use of insulin (HCC)  - COMPLETE METABOLIC PANEL WITH GFR - CBC with Differential - Lipid panel - HgB A1c  2. Acute idiopathic gout of left ankle  - Uric Acid -Colchicine 0.6 daily, #30 with 3 refills. -naproxen 500 mg #20 one po bid for 5-10 days.    Return in about 6 months (around 01/01/2017).  The patient was given clear instructions to go to ER or return to medical center if symptoms don't improve, worsen or new problems develop.  The patient verbalized understanding.    Henrietta HooverLinda C Bernhardt FNP  07/04/2016, 11:59 AM

## 2016-07-05 LAB — COMPLETE METABOLIC PANEL WITH GFR
ALT: 21 U/L (ref 9–46)
AST: 15 U/L (ref 10–35)
Albumin: 4.3 g/dL (ref 3.6–5.1)
Alkaline Phosphatase: 93 U/L (ref 40–115)
BILIRUBIN TOTAL: 0.6 mg/dL (ref 0.2–1.2)
BUN: 15 mg/dL (ref 7–25)
CO2: 23 mmol/L (ref 20–31)
CREATININE: 1.51 mg/dL — AB (ref 0.70–1.33)
Calcium: 10.1 mg/dL (ref 8.6–10.3)
Chloride: 103 mmol/L (ref 98–110)
GFR, EST AFRICAN AMERICAN: 60 mL/min (ref >=60)
GFR, Est Non African American: 52 mL/min — ABNORMAL LOW (ref >=60)
Glucose, Bld: 121 mg/dL — ABNORMAL HIGH (ref 65–99)
Potassium: 4.4 mmol/L (ref 3.5–5.3)
Sodium: 139 mmol/L (ref 135–146)
TOTAL PROTEIN: 8.3 g/dL — AB (ref 6.1–8.1)

## 2016-07-05 LAB — LIPID PANEL
CHOL/HDL RATIO: 5.7 ratio — AB (ref <5.0)
CHOLESTEROL: 199 mg/dL (ref <200)
HDL: 35 mg/dL — ABNORMAL LOW (ref >40)
LDL Cholesterol: 130 mg/dL — ABNORMAL HIGH (ref <100)
Triglycerides: 169 mg/dL — ABNORMAL HIGH (ref <150)
VLDL: 34 mg/dL — AB (ref <30)

## 2016-07-05 LAB — URIC ACID: Uric Acid, Serum: 10.4 mg/dL — ABNORMAL HIGH (ref 4.0–8.0)

## 2016-07-13 ENCOUNTER — Other Ambulatory Visit: Payer: Self-pay | Admitting: Family Medicine

## 2016-07-13 DIAGNOSIS — I10 Essential (primary) hypertension: Secondary | ICD-10-CM

## 2016-07-15 ENCOUNTER — Other Ambulatory Visit: Payer: Self-pay

## 2016-07-15 DIAGNOSIS — I10 Essential (primary) hypertension: Secondary | ICD-10-CM

## 2016-07-15 MED ORDER — ENALAPRIL MALEATE 20 MG PO TABS: 20.0000 mg | ORAL_TABLET | Freq: Every day | ORAL | 1 refills | Status: DC

## 2016-07-18 ENCOUNTER — Other Ambulatory Visit: Payer: Self-pay | Admitting: Family Medicine

## 2016-07-18 ENCOUNTER — Other Ambulatory Visit: Payer: Self-pay

## 2016-07-18 MED ORDER — METFORMIN HCL 500 MG PO TABS: 500.0000 mg | ORAL_TABLET | Freq: Two times a day (BID) | ORAL | 3 refills | Status: DC

## 2016-07-18 MED ORDER — PRAVASTATIN SODIUM 40 MG PO TABS: 40.0000 mg | ORAL_TABLET | Freq: Every day | ORAL | 0 refills | Status: DC

## 2016-08-09 ENCOUNTER — Telehealth: Payer: Self-pay | Admitting: *Deleted

## 2016-08-09 NOTE — Telephone Encounter (Signed)
[facility] to clarify patient's regimen. He has only been receiving Rx for Descovy. Last received Tivicay several months ago. He reported on 07/04/16 that he was not taking Tivicay (PCP visit). Left patient a message to call the clinic. Per Ulyses SouthwardMinh Pham, pharmacist he will need to come in for labs and genotype. Wendall MolaJacqueline Rhena Glace

## 2016-08-11 ENCOUNTER — Telehealth: Payer: Self-pay

## 2016-08-11 NOTE — Telephone Encounter (Signed)
Patient returning call from clinic.    Patient states he is only taking Descovy and has not taken it in a while due to finances.  He says he is not insured and has not way to pay.    I have asked him to come for visit with the pharmacist

## 2016-08-11 NOTE — Telephone Encounter (Signed)
He will come on Monday, March , 12, 2018 to see pharmacist and complete labs.  He will also need to see financial counselor for medication assistance.   Laurell Josephsammy K King, RN

## 2016-08-12 ENCOUNTER — Ambulatory Visit: Payer: Medicare Other

## 2016-08-15 ENCOUNTER — Other Ambulatory Visit (HOSPITAL_COMMUNITY)
Admission: RE | Admit: 2016-08-15 | Discharge: 2016-08-15 | Disposition: A | Discharge status: Home / Self Care | Payer: Medicare Other | Source: Ambulatory Visit | Attending: Internal Medicine | Admitting: Internal Medicine

## 2016-08-15 ENCOUNTER — Other Ambulatory Visit: Payer: Self-pay | Admitting: Pharmacist Clinician (PhC)/ Clinical Pharmacy Specialist

## 2016-08-15 ENCOUNTER — Ambulatory Visit (INDEPENDENT_AMBULATORY_CARE_PROVIDER_SITE_OTHER): Payer: Medicare Other | Admitting: Pharmacist Clinician (PhC)/ Clinical Pharmacy Specialist

## 2016-08-15 DIAGNOSIS — Z113 Encounter for screening for infections with a predominantly sexual mode of transmission: Secondary | ICD-10-CM | POA: Diagnosis not present

## 2016-08-15 DIAGNOSIS — B20 Human immunodeficiency virus [HIV] disease: Secondary | ICD-10-CM

## 2016-08-15 LAB — BASIC METABOLIC PANEL
BUN: 14 mg/dL (ref 7–25)
CO2: 24 mmol/L (ref 20–31)
CREATININE: 1.45 mg/dL — AB (ref 0.70–1.33)
Calcium: 9.3 mg/dL (ref 8.6–10.3)
Chloride: 104 mmol/L (ref 98–110)
Glucose, Bld: 148 mg/dL — ABNORMAL HIGH (ref 65–99)
Potassium: 4.2 mmol/L (ref 3.5–5.3)
Sodium: 139 mmol/L (ref 135–146)

## 2016-08-15 LAB — T-HELPER CELL (CD4) - (RCID CLINIC ONLY)
CD4 T CELL HELPER: 22 % — AB (ref 33–55)
CD4 T Cell Abs: 460 /uL (ref 400–2700)

## 2016-08-15 NOTE — Progress Notes (Signed)
HPI: Chad Moses is a 56 y.o. male who is here to visit with pharmacy because apparently he has had some issue getting some of his ART.   Allergies: No Known Allergies  Vitals:    Past Medical History: Past Medical History:  Diagnosis Date  . Blood transfusion without reported diagnosis   . Cortical visual impairment 2002  . Depression   . Diabetes mellitus without complication (HCC)   . Gout   . HIV disease (HCC)   . Hyperlipidemia   . Hypertension   . Stroke [facility](HCC) 2002    Social History: Social History   Social History  . Marital status: Divorced    Spouse name: N/A  . Number of children: N/A  . Years of education: N/A   Social History Main Topics  . Smoking status: Former Smoker    Packs/day: 0.00    Types: Cigarettes, Cigars  . Smokeless tobacco: Never Used     Comment: given quitline info  . Alcohol use No  . Drug use: No  . Sexual activity: Not Currently    Partners: Female, Male     Comment: pt. given condoms   Other Topics Concern  . Not on file   Social History Narrative  . No narrative on file    Previous Regimen:   Current Regimen: Descovy/DTG  Labs: HIV 1 RNA Quant (copies/mL)  Date Value  09/10/2015 <20  05/15/2015 <20  06/10/2014 33 (H)   CD4 T Cell Abs (/uL)  Date Value  09/10/2015 540  05/15/2015 600  06/10/2014 320 (L)   Hep B S Ab (no units)  Date Value  01/17/2008 NEG   Hepatitis B Surface Ag (no units)  Date Value  01/17/2008 NEG   HCV Ab (no units)  Date Value  06/10/2014 NEGATIVE    CrCl: CrCl cannot be calculated (Patient's most recent lab result is older than the maximum 21 days allowed.).  Lipids:    Component Value Date/Time   CHOL 199 07/04/2016 1117   TRIG 169 (H) 07/04/2016 1117   HDL 35 (L) 07/04/2016 1117   CHOLHDL 5.7 (H) 07/04/2016 1117   VLDL 34 (H) 07/04/2016 1117   LDLCALC 130 (H) 07/04/2016 1117    Assessment: Chad Moses was well suppressed on DTG/Descovy and has not seen with us  since 4/17. We got a message from Tammy the other day that it could be due to financial issue. Therefore, we brought him in to see what has been the issue. Apparently, his last refill for the Tivicay was in 1/18, therefore, he has been Desvovy only since Feb 2018. Asked him if it was due to financial issue? He said that they just couldn't fill. Called [facility] to see why this was the case. They stated that they had no refills and did not send over the refill request. I told them that this is totally inappropriate. I told him to stop his therapy completely today until the genotype is back. I'm hoping that his regimen has not ruined completely. He will come back to see me in 3 wks so we can restart his ART based on the Genotype today.   Offered him the Menveo vaccine but he refused.  Recommendations:  Stop DTG/Descovy HIV VL with genotype+ today, CD4, bmet, RPR, GC See me back in 3 wks  Ulyses SouthwardMinh Ellagrace Yoshida, PharmD, BCPS, AAHIVP, CPP Clinical Infectious Disease Pharmacist [facility] 08/15/2016, 10:48 AM

## 2016-08-15 NOTE — Patient Instructions (Signed)
We will do labs today Come back and see me in 3 wks Stop Tivicay and Descovy today

## 2016-08-16 LAB — RPR

## 2016-08-17 LAB — URINE CYTOLOGY ANCILLARY ONLY
Chlamydia: NEGATIVE
NEISSERIA GONORRHEA: NEGATIVE

## 2016-08-17 LAB — HIV-1 RNA,QN PCR W/REFLEX GENOTYPE
HIV-1 RNA, QN PCR: 2.68 Log cps/mL — ABNORMAL HIGH
HIV-1 RNA, QN PCR: 482 Copies/mL — ABNORMAL HIGH

## 2016-09-06 ENCOUNTER — Other Ambulatory Visit: Payer: Self-pay | Admitting: Internal Medicine

## 2016-09-06 ENCOUNTER — Ambulatory Visit (INDEPENDENT_AMBULATORY_CARE_PROVIDER_SITE_OTHER): Payer: Medicare Other | Admitting: Pharmacist Clinician (PhC)/ Clinical Pharmacy Specialist

## 2016-09-06 DIAGNOSIS — B20 Human immunodeficiency virus [HIV] disease: Secondary | ICD-10-CM | POA: Diagnosis not present

## 2016-09-06 MED ORDER — COLCHICINE 0.6 MG PO TABS: 0.3000 mg | ORAL_TABLET | Freq: Every day | ORAL | 3 refills | Status: DC

## 2016-09-06 MED ORDER — EMTRICITABINE-TENOFOVIR AF 200-25 MG PO TABS: 1.0000 | ORAL_TABLET | Freq: Every day | ORAL | 5 refills | Status: DC

## 2016-09-06 MED ORDER — DARUNAVIR-COBICISTAT 800-150 MG PO TABS: 1.0000 | ORAL_TABLET | Freq: Every day | ORAL | 5 refills | Status: DC

## 2016-09-06 MED ORDER — DOLUTEGRAVIR SODIUM 50 MG PO TABS: 50.0000 mg | ORAL_TABLET | Freq: Every day | ORAL | 5 refills | Status: DC

## 2016-09-06 MED FILL — DESCOVY 200-25 MG TABS: 200-25 | 30 days supply | Qty: 30 | Fill #0

## 2016-09-06 MED FILL — PREZCOBIX 800 MG-150 MG TAB: 800-150 | 30 days supply | Qty: 30 | Fill #0

## 2016-09-06 MED FILL — TIVICAY 50 MG TABLET: 50 | 30 days supply | Qty: 30 | Fill #0

## 2016-09-06 NOTE — Progress Notes (Signed)
HPI: Chad Moses is a 56 y.o. male who is here to follow up with me again due to a medication access issue.   Allergies: No Known Allergies  Vitals:    Past Medical History: Past Medical History:  Diagnosis Date  . Blood transfusion without reported diagnosis   . Cortical visual impairment 2002  . Depression   . Diabetes mellitus without complication (HCC)   . Gout   . HIV disease (HCC)   . Hyperlipidemia   . Hypertension   . Stroke [facility]) 2002    Social History: Social History   Social History  . Marital status: Divorced    Spouse name: N/A  . Number of children: N/A  . Years of education: N/A   Social History Main Topics  . Smoking status: Former Smoker    Packs/day: 0.00    Types: Cigarettes, Cigars  . Smokeless tobacco: Never Used     Comment: given quitline info  . Alcohol use No  . Drug use: No  . Sexual activity: Not Currently    Partners: Female, Male     Comment: pt. given condoms   Other Topics Concern  . Not on file   Social History Narrative  . No narrative on file    Previous Regimen: DTG/Descovy  Current Regimen: Off  Labs: HIV 1 RNA Quant (copies/mL)  Date Value  09/10/2015 <20  05/15/2015 <20  06/10/2014 33 (H)   CD4 T Cell Abs (/uL)  Date Value  08/15/2016 460  09/10/2015 540  05/15/2015 600   Hep B S Ab (no units)  Date Value  01/17/2008 NEG   Hepatitis B Surface Ag (no units)  Date Value  01/17/2008 NEG   HCV Ab (no units)  Date Value  06/10/2014 NEGATIVE    CrCl: CrCl cannot be calculated (Patient's most recent lab result is older than the maximum 21 days allowed.).  Lipids:    Component Value Date/Time   CHOL 199 07/04/2016 1117   TRIG 169 (H) 07/04/2016 1117   HDL 35 (L) 07/04/2016 1117   CHOLHDL 5.7 (H) 07/04/2016 1117   VLDL 34 (H) 07/04/2016 1117   LDLCALC 130 (H) 07/04/2016 1117    Assessment: Chad Moses had some issue with the refills of his ART late last year. He ended up taking Descovy by  itself since Feb 2018. We told him to stop all of his ART at the last visit so we could do a genotype to look for resistance. His VL came back at 482 so they couldn't do the genotype. I worry that he may have some resistance already. Therefore, I'm going to repeat the VL with reflex to genotype today since his VL should be >2000 at this point. At the same time, I'm going to resume with ART regimen with DTG/Descovy but add on Prezcobix. There is an interaction with his cobicistat and colchicine. Going to reduce colchine by half. We can resume the normal dose if Prezcobix is not needed in after his genotype.   Meds were send to [facility] pharmacy and Chad Moses went through [facility] for his copay issue. He is going to pick up the meds this afternoon.    Recommendations:  HIV VL with reflex today Restart DTG/Descovy Start Prezcobix 1 PO qday Reduce colchicine to 0.3mg  PO qday F/u back in 1 mo  Ulyses Southward, PharmD, BCPS, AAHIVP, CPP Clinical Infectious Disease Pharmacist [facility] 09/06/2016, 10:46 AM

## 2016-09-06 NOTE — Patient Instructions (Addendum)
Labs today Start Prezcobix 1 tablet daily Restart Tivicay  daily Restart Descovy 1 tablet daily Reduce colchicine dose to 0.3mg  (half tablet) daily Come back and see me 10/12/16

## 2016-09-08 LAB — HIV-1 RNA,QN PCR W/REFLEX GENOTYPE
HIV-1 RNA, QN PCR: 15300 Copies/mL — ABNORMAL HIGH
HIV-1 RNA, QN PCR: 4.18 Log cps/mL — ABNORMAL HIGH

## 2016-09-14 LAB — HIV-1 GENOTYPR PLUS

## 2016-10-06 MED FILL — TIVICAY 50 MG TABLET: 50 | 30 days supply | Qty: 30 | Fill #1

## 2016-10-06 MED FILL — PREZCOBIX 800 MG-150 MG TAB: 800-150 | 30 days supply | Qty: 30 | Fill #1

## 2016-10-06 MED FILL — DESCOVY 200-25 MG TABS: 200-25 | 30 days supply | Qty: 30 | Fill #1

## 2016-10-12 ENCOUNTER — Ambulatory Visit (INDEPENDENT_AMBULATORY_CARE_PROVIDER_SITE_OTHER): Payer: Medicare Other | Admitting: Pharmacist Clinician (PhC)/ Clinical Pharmacy Specialist

## 2016-10-12 DIAGNOSIS — B20 Human immunodeficiency virus [HIV] disease: Secondary | ICD-10-CM | POA: Diagnosis not present

## 2016-10-12 LAB — BASIC METABOLIC PANEL
BUN: 18 mg/dL (ref 7–25)
CALCIUM: 9.9 mg/dL (ref 8.6–10.3)
CO2: 20 mmol/L (ref 20–31)
Chloride: 107 mmol/L (ref 98–110)
Creat: 1.85 mg/dL — ABNORMAL HIGH (ref 0.70–1.33)
GLUCOSE: 111 mg/dL — AB (ref 65–99)
Potassium: 4.9 mmol/L (ref 3.5–5.3)
SODIUM: 141 mmol/L (ref 135–146)

## 2016-10-12 NOTE — Patient Instructions (Addendum)
Come back and see me on August 7 at 0930

## 2016-10-12 NOTE — Progress Notes (Signed)
HPI: Chad Moses is a 56 y.o. male who is here to f/u with Chad Moses again for adherence and labs.   Allergies: No Known Allergies  Vitals:    Past Medical History: Past Medical History:  Diagnosis Date  . Blood transfusion without reported diagnosis   . Cortical visual impairment 2002  . Depression   . Diabetes mellitus without complication (HCC)   . Gout   . HIV disease (HCC)   . Hyperlipidemia   . Hypertension   . Stroke [facility](HCC) 2002    Social History: Social History   Social History  . Marital status: Divorced    Spouse name: N/A  . Number of children: N/A  . Years of education: N/A   Social History Main Topics  . Smoking status: Former Smoker    Packs/day: 0.00    Types: Cigarettes, Cigars  . Smokeless tobacco: Never Used     Comment: given quitline info  . Alcohol use No  . Drug use: No  . Sexual activity: Not Currently    Partners: Female, Male     Comment: pt. given condoms   Other Topics Concern  . Not on file   Social History Narrative  . No narrative on file    Previous Regimen: DTG/Descovy  Current Regimen: DTG/Descovy/Prezcobix  Labs: HIV 1 RNA Quant (copies/mL)  Date Value  09/10/2015 <20  05/15/2015 <20  06/10/2014 33 (H)   CD4 T Cell Abs (/uL)  Date Value  08/15/2016 460  09/10/2015 540  05/15/2015 600   Hep B S Ab (no units)  Date Value  01/17/2008 NEG   Hepatitis B Surface Ag (no units)  Date Value  01/17/2008 NEG   HCV Ab (no units)  Date Value  06/10/2014 NEGATIVE    CrCl: CrCl cannot be calculated (Patient's most recent lab result is older than the maximum 21 days allowed.).  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 He is tolerating the new regimen well except for the size of the Prezcobix. Offered to change him to another regimen with smaller size tablets but he would rather stay on this right now. Told him that in the near future, [facility]   In the meantime, we will continue with the current regimen. We will get labs today and he will f/u with me in August to repeat labs at that time. Then, he can f/u with Dr. Luciana Axeomer after that.   Recommendations:  Cont Prez/Descovy/DTG Labs today F/u with pharmacy in 3 month  Ulyses SouthwardMinh Pham, PharmD, BCPS, AAHIVP, CPP Clinical Infectious Disease Pharmacist [facility] 10/12/2016, 11:34 AM

## 2016-10-14 LAB — HIV-1 RNA QUANT-NO REFLEX-BLD
HIV 1 RNA Quant: <20 copies/mL — AB
HIV-1 RNA Quant, Log: <1.3 Log copies/mL — AB

## 2016-11-07 MED FILL — PREZCOBIX 800 MG-150 MG TAB: 800-150 | 30 days supply | Qty: 30 | Fill #2

## 2016-11-07 MED FILL — TIVICAY 50 MG TABLET: 50 | 30 days supply | Qty: 30 | Fill #2

## 2016-11-07 MED FILL — DESCOVY 200-25 MG TABS: 200-25 | 30 days supply | Qty: 30 | Fill #2

## 2016-12-01 MED FILL — PREZCOBIX 800 MG-150 MG TAB: 800-150 | 30 days supply | Qty: 30 | Fill #3

## 2016-12-01 MED FILL — DESCOVY 200-25 MG TABS: 200-25 | 30 days supply | Qty: 30 | Fill #3

## 2016-12-01 MED FILL — TIVICAY 50 MG TABLET: 50 | 30 days supply | Qty: 30 | Fill #3

## 2016-12-01 MED FILL — COLCRYS 0.6 MG TABLET: 0.6 | 30 days supply | Qty: 15 | Fill #0

## 2017-01-02 ENCOUNTER — Ambulatory Visit: Payer: Medicare Other | Admitting: Family Medicine

## 2017-01-02 MED FILL — TIVICAY 50 MG TABLET: 50 | 30 days supply | Qty: 30 | Fill #4

## 2017-01-02 MED FILL — PREZCOBIX 800 MG-150 MG TAB: 800-150 | 30 days supply | Qty: 30 | Fill #4

## 2017-01-02 MED FILL — COLCRYS 0.6 MG TABLET: 0.6 | 30 days supply | Qty: 15 | Fill #1

## 2017-01-02 MED FILL — DESCOVY 200-25 MG TABS: 200-25 | 30 days supply | Qty: 30 | Fill #4

## 2017-01-09 ENCOUNTER — Ambulatory Visit (INDEPENDENT_AMBULATORY_CARE_PROVIDER_SITE_OTHER): Payer: Medicare Other | Admitting: Family Medicine

## 2017-01-09 ENCOUNTER — Encounter: Payer: Self-pay | Admitting: Family Medicine

## 2017-01-09 VITALS — BP 146/86 | HR 92 | Temp 98.6°F | Resp 16 | Ht 71.0 in | Wt 206.0 lb

## 2017-01-09 DIAGNOSIS — Z13220 Encounter for screening for lipoid disorders: Secondary | ICD-10-CM | POA: Diagnosis not present

## 2017-01-09 DIAGNOSIS — E119 Type 2 diabetes mellitus without complications: Secondary | ICD-10-CM

## 2017-01-09 DIAGNOSIS — I739 Peripheral vascular disease, unspecified: Secondary | ICD-10-CM | POA: Diagnosis not present

## 2017-01-09 DIAGNOSIS — B20 Human immunodeficiency virus [HIV] disease: Secondary | ICD-10-CM | POA: Diagnosis not present

## 2017-01-09 DIAGNOSIS — I10 Essential (primary) hypertension: Secondary | ICD-10-CM

## 2017-01-09 LAB — COMPLETE METABOLIC PANEL WITH GFR
ALT: 30 U/L (ref 9–46)
AST: 17 U/L (ref 10–35)
Albumin: 4 g/dL (ref 3.6–5.1)
Alkaline Phosphatase: 87 U/L (ref 40–115)
BILIRUBIN TOTAL: 0.4 mg/dL (ref 0.2–1.2)
BUN: 12 mg/dL (ref 7–25)
CO2: 21 mmol/L (ref 20–32)
Calcium: 9.4 mg/dL (ref 8.6–10.3)
Chloride: 105 mmol/L (ref 98–110)
Creat: 1.52 mg/dL — ABNORMAL HIGH (ref 0.70–1.33)
GFR, EST AFRICAN AMERICAN: 59 mL/min — AB (ref >=60)
GFR, EST NON AFRICAN AMERICAN: 51 mL/min — AB (ref >=60)
GLUCOSE: 204 mg/dL — AB (ref 65–99)
Potassium: 4.1 mmol/L (ref 3.5–5.3)
SODIUM: 136 mmol/L (ref 135–146)
TOTAL PROTEIN: 7.1 g/dL (ref 6.1–8.1)

## 2017-01-09 LAB — LIPID PANEL
CHOL/HDL RATIO: 7.1 ratio — AB (ref <5.0)
Cholesterol: 212 mg/dL — ABNORMAL HIGH (ref <200)
HDL: 30 mg/dL — AB (ref >40)
LDL Cholesterol: 150 mg/dL — ABNORMAL HIGH (ref <100)
TRIGLYCERIDES: 161 mg/dL — AB (ref <150)
VLDL: 32 mg/dL — ABNORMAL HIGH (ref <30)

## 2017-01-09 LAB — POCT URINALYSIS DIP (DEVICE)
BILIRUBIN URINE: NEGATIVE
Glucose, UA: NEGATIVE mg/dL
HGB URINE DIPSTICK: NEGATIVE
Ketones, ur: NEGATIVE mg/dL
NITRITE: NEGATIVE
Protein, ur: NEGATIVE mg/dL
Specific Gravity, Urine: 1.015 (ref 1.005–1.030)
UROBILINOGEN UA: 0.2 mg/dL (ref 0.0–1.0)
pH: 5.5 (ref 5.0–8.0)

## 2017-01-09 LAB — POCT GLYCOSYLATED HEMOGLOBIN (HGB A1C): Hemoglobin A1C: 6.4

## 2017-01-09 MED ORDER — METFORMIN HCL 500 MG PO TABS: 500.0000 mg | ORAL_TABLET | Freq: Two times a day (BID) | ORAL | 3 refills | Status: DC

## 2017-01-09 MED ORDER — ENALAPRIL MALEATE 20 MG PO TABS: 20.0000 mg | ORAL_TABLET | Freq: Every day | ORAL | 1 refills | Status: DC

## 2017-01-09 MED ORDER — ATENOLOL 100 MG PO TABS: 100.0000 mg | ORAL_TABLET | Freq: Every day | ORAL | 2 refills | Status: DC

## 2017-01-09 MED ORDER — PRAVASTATIN SODIUM 40 MG PO TABS: 40.0000 mg | ORAL_TABLET | Freq: Every day | ORAL | 0 refills | Status: DC

## 2017-01-09 NOTE — Progress Notes (Signed)
Patient ID: Chad Moses, male    DOB: 07/19/1960, 56 y.o.   MRN: [id number]  PCP: Bing NeighborsHarris, Tayo Maute S, FNP  Chief Complaint  Patient presents with  . Follow-up    6 month out of blood pressure meds for a week    Subjective:  HPI Chad Moses is a 56 y.o. male presents for hypertension and diabetes follow-up.  Medical problems: Hypertension, PVD, Diabetes, HIV Chad Moses reports that he has been out of anithypertension medications for over 1 week. [name] reports compliance with medication regimen. He doesn't routinely monitor his blood pressure or blood sugar. 6 months. [name]'s last A1C was 6.3. He has lost approximately 5 lbs since his last office visit. His current Body mass index is 28.73 kg/m.  He reports routine exercise consisting of vigorous walking and implementing increased fruits and vegetables in diet. He denies any other complaints today. Social History   Social History  . Marital status: Divorced    Spouse name: N/A  . Number of children: N/A  . Years of education: N/A   Occupational History  . Not on file.   Social History Main Topics  . Smoking status: Former Smoker    Packs/day: 0.00    Types: Cigarettes, Cigars  . Smokeless tobacco: Never Used     Comment: given quitline info  . Alcohol use No  . Drug use: No  . Sexual activity: Not Currently    Partners: Female, Male     Comment: pt. given condoms   Other Topics Concern  . Not on file   Social History Narrative  . No narrative on file    Family History  Problem Relation Age of Onset  . Diabetes Mother   . Hypertension Mother   . Stroke Brother   . Cancer Brother   . Colon cancer Neg Hx   . Esophageal cancer Neg Hx   . Rectal cancer Neg Hx   . Stomach cancer Neg Hx    Review of Systems See HPI  Patient Active Problem List   Diagnosis Date Noted  . Tendinitis 09/24/2015  . Acute gouty arthritis 06/16/2014  . Depression 06/10/2014  . ASCVD (arteriosclerotic cardiovascular disease)  06/09/2014  . Elevated lipids 06/09/2014  . Diabetes mellitus type 2 in nonobese (HCC) 06/09/2014  . Cortical visual impairment   . Stroke (HCC)   . Peripheral vascular disease (HCC) 04/16/2014  . Left foot pain 04/15/2014  . Blurred vision, left eye 02/19/2014  . Need for Tdap vaccination 02/19/2014  . Need for prophylactic vaccination and inoculation against influenza 02/19/2014  . Encounter for long-term (current) use of medications 09/17/2013  . Screening examination for venereal disease 09/17/2013  . Rash and nonspecific skin eruption 03/05/2013  . Tobacco abuse 11/01/2012  . Vision blurred 06/19/2012  . Chronic renal insufficiency, stage II (mild) 11/19/2011  . Routine health maintenance 07/26/2011  . KNEE PAIN, LEFT 05/28/2009  . ONYCHOMYCOSIS, TOENAILS 01/15/2009  . Pain, dental 07/21/2008  . Human immunodeficiency virus (HIV) disease (HCC) 04/14/2008  . Hyperlipidemia LDL goal <130 04/14/2008  . Gout 04/14/2008  . Essential hypertension 04/14/2008    No Known Allergies  Prior to Admission medications   Medication Sig Start Date End Date Taking? Authorizing Provider  aspirin 81 MG tablet Take 1 tablet (81 mg total) by mouth daily. 06/11/13   Gardiner Barefootomer, Robert W, MD  atenolol (TENORMIN) 100 MG tablet TAKE 1 TABLET BY MOUTH DAILY 07/13/16   Henrietta HooverBernhardt, Linda C, NP  colchicine 0.6 MG tablet Take 0.5  tablets (0.3 mg total) by mouth daily. 09/06/16   Pham, Minh Q, RPH  darunavir-cobicistat (PREZCOBIX) 800-150 MG tablet Take 1 tablet by mouth daily with breakfast. Swallow whole. Do NOT crush, break or chew tablets. Take with food. 09/06/16   Gardiner Barefoot, MD  dolutegravir (TIVICAY) 50 MG tablet Take 1 tablet (50 mg total) by mouth daily. 09/06/16   Gardiner Barefoot, MD  emtricitabine-tenofovir AF (DESCOVY) 200-25 MG tablet Take 1 tablet by mouth daily with breakfast. 09/06/16   Comer, Belia Heman, MD  enalapril (VASOTEC) 20 MG tablet Take 1 tablet (20 mg total) by mouth daily. 07/15/16   Henrietta Hoover, NP  HYDROcodone-acetaminophen (NORCO) 10-325 MG tablet Take 1 tablet by mouth every 6 (six) hours as needed. Patient not taking: Reported on 07/04/2016 12/22/15   Tharon Aquas, PA  ketoconazole (NIZORAL) 2 % cream Apply 1 application topically 2 (two) times daily. 07/04/16   Henrietta Hoover, NP  metFORMIN (GLUCOPHAGE) 500 MG tablet Take 1 tablet (500 mg total) by mouth 2 (two) times daily with a meal. 07/18/16   Henrietta Hoover, NP  naproxen (NAPROSYN) 375 MG tablet Take 1 tablet (375 mg total) by mouth 2 (two) times daily. Patient not taking: Reported on 08/15/2016 07/04/16   Henrietta Hoover, NP  ondansetron (ZOFRAN) 4 MG tablet Take 1 tablet (4 mg total) by mouth every 6 (six) hours. 12/22/15   Tharon Aquas, PA  pravastatin (PRAVACHOL) 40 MG tablet Take 1 tablet (40 mg total) by mouth daily. 07/18/16   Henrietta Hoover, NP  predniSONE (DELTASONE) 10 MG tablet Take 2 tablets (20 mg total) by mouth daily. Patient not taking: Reported on 07/04/2016 12/22/15   Tharon Aquas, PA  triamcinolone ointment (KENALOG) 0.1 % Apply topically 2 (two) times daily. 07/04/16   Henrietta Hoover, NP    Past Medical, Surgical Family and Social History reviewed and updated.    Objective:   Vitals:   01/09/17 1049  BP: (!) 146/86  Pulse: 92  Resp: 16  Temp: 98.6 F (37 C)  SpO2: 100%     Wt Readings from Last 3 Encounters:  07/04/16 198 lb (89.8 kg)  12/21/15 203 lb (92.1 kg)  10/28/15 208 lb (94.3 kg)   Physical Exam  Constitutional: He is oriented to person, place, and time. He appears well-developed and well-nourished.  HENT:  Head: Normocephalic and atraumatic.  Eyes: Pupils are equal, round, and reactive to light. Conjunctivae are normal.  Neck: Normal range of motion. Neck supple.  Cardiovascular: Normal rate, regular rhythm, normal heart sounds and intact distal pulses.   Pulmonary/Chest: Effort normal and breath sounds normal.  Abdominal: Soft. Bowel sounds are  normal.  Musculoskeletal: Normal range of motion.  Neurological: He is alert and oriented to person, place, and time.  Skin: Skin is warm and dry.  Psychiatric: He has a normal mood and affect. His behavior is normal. Judgment and thought content normal.   Assessment & Plan:  1. Peripheral vascular disease (HCC), asymptomatic  2. Essential hypertension, uncontrolled today, although patient is not currently taking medication  3. Diabetes mellitus type 2 in nonobese (HCC), controlled, A1C today 6.4 4. Human immunodeficiency virus (HIV) disease (HCC) 5. Screening, lipid  Orders Placed This Encounter: - CBC with Differential -COMPLETE METABOLIC PANEL WITH GFR - Lipid panel - Thyroid Panel With TSH - POCT glycosylated hemoglobin (Hb A1C)  Godfrey Pick. Tiburcio Pea, MSN, FNP-C [facility]  [phone]  [address] [place], [place] [address] [phone]

## 2017-01-10 ENCOUNTER — Ambulatory Visit (INDEPENDENT_AMBULATORY_CARE_PROVIDER_SITE_OTHER): Payer: Medicare Other | Admitting: Pharmacist Clinician (PhC)/ Clinical Pharmacy Specialist

## 2017-01-10 DIAGNOSIS — B20 Human immunodeficiency virus [HIV] disease: Secondary | ICD-10-CM | POA: Diagnosis not present

## 2017-01-10 LAB — CBC WITH DIFFERENTIAL/PLATELET
BASOS ABS: 0 {cells}/uL (ref 0–200)
BASOS PCT: 0 %
EOS PCT: 6 %
Eosinophils Absolute: 354 cells/uL (ref 15–500)
HCT: 43 % (ref 38.5–50.0)
Hemoglobin: 14.1 g/dL (ref 13.2–17.1)
LYMPHS PCT: 40 %
Lymphs Abs: 2360 cells/uL (ref 850–3900)
MCH: 29.3 pg (ref 27.0–33.0)
MCHC: 32.8 g/dL (ref 32.0–36.0)
MCV: 89.2 fL (ref 80.0–100.0)
MPV: 9.9 fL (ref 7.5–12.5)
Monocytes Absolute: 177 cells/uL — ABNORMAL LOW (ref 200–950)
Monocytes Relative: 3 %
NEUTROS ABS: 3009 {cells}/uL (ref 1500–7800)
Neutrophils Relative %: 51 %
Platelets: 268 10*3/uL (ref 140–400)
RBC: 4.82 MIL/uL (ref 4.20–5.80)
RDW: 16.5 % — AB (ref 11.0–15.0)
WBC: 5.9 10*3/uL (ref 3.8–10.8)

## 2017-01-10 LAB — THYROID PANEL WITH TSH
Free Thyroxine Index: 2.7 (ref 1.4–3.8)
T3 UPTAKE: 30 % (ref 22–35)
T4 TOTAL: 9 ug/dL (ref 4.9–10.5)
TSH: 2.2 m[IU]/L (ref 0.40–4.50)

## 2017-01-10 NOTE — Patient Instructions (Addendum)
Continue your Tivicay/Descovy/Prezcobix Follow up with Dr. Luciana Axeomer on Sept 11 at 1000

## 2017-01-10 NOTE — Progress Notes (Signed)
HPI: Chad Moses is a 56 y.o. male who is here to see pharmacy for his compliance visit.   Allergies: No Known Allergies  Vitals:    Past Medical History: Past Medical History:  Diagnosis Date  . Blood transfusion without reported diagnosis   . Cortical visual impairment 2002  . Depression   . Diabetes mellitus without complication (HCC)   . Gout   . HIV disease (HCC)   . Hyperlipidemia   . Hypertension   . Stroke [facility](HCC) 2002    Social History: Social History   Social History  . Marital status: Divorced    Spouse name: N/A  . Number of children: N/A  . Years of education: N/A   Social History Main Topics  . Smoking status: Former Smoker    Packs/day: 0.00    Types: Cigarettes, Cigars  . Smokeless tobacco: Never Used     Comment: given quitline info  . Alcohol use No  . Drug use: No  . Sexual activity: Not Currently    Partners: Female, Male     Comment: pt. given condoms   Other Topics Concern  . Not on file   Social History Narrative  . No narrative on file    Previous Regimen: DTG/Descovy  Current Regimen: DTG/Descovy/Prezcobix  Labs: HIV 1 RNA Quant (copies/mL)  Date Value  10/12/2016 <20 DETECTED (A)  09/10/2015 <20  05/15/2015 <20   CD4 T Cell Abs (/uL)  Date Value  08/15/2016 460  09/10/2015 540  05/15/2015 600   Hep B S Ab (no units)  Date Value  01/17/2008 NEG   Hepatitis B Surface Ag (no units)  Date Value  01/17/2008 NEG   HCV Ab (no units)  Date Value  06/10/2014 NEGATIVE    CrCl: CrCl cannot be calculated (Patient's most recent lab result is older than the maximum 21 days allowed.).  Lipids:    Component Value Date/Time   CHOL 199 07/04/2016 1117   TRIG 169 (H) 07/04/2016 1117   HDL 35 (L) 07/04/2016 1117   CHOLHDL 5.7 (H) 07/04/2016 1117   VLDL 34 (H) 07/04/2016 1117   LDLCALC 130 (H) 07/04/2016 1117    Assessment: Chad Moses is here to f/u with pharmacy to recheck his labs before a f/u with Dr. Luciana Axeomer. He is  doing well on the current regimen. Since he only developed m184v when he was taking descovy by itself due to the pharmacy issue, he can be on DTG/Descovy or Symtuza in the near future. Will let Dr. Luciana Axeomer make the call on that. Most of the plans probably haven't cover symtuza yet since it's so new. We'll get labs today and schedule a f/u with Dr. Luciana Axeomer in sept. Offered him the Menveo vaccine today but he refused it.   Recommendations:  Cont DTG/Descovy/Prezcobix for now HIV labs today F/u with Dr. Luciana Axeomer in Sept  Aneudy Champlain, PharmD, BCPS, AAHIVP, CPP Clinical Infectious Disease Pharmacist [facility] 01/10/2017, 9:17 AM

## 2017-01-11 LAB — T-HELPER CELL (CD4) - (RCID CLINIC ONLY)
CD4 % Helper T Cell: 17 % — ABNORMAL LOW (ref 33–55)
CD4 T Cell Abs: 580 /uL (ref 400–2700)

## 2017-01-12 LAB — HIV-1 RNA QUANT-NO REFLEX-BLD
HIV 1 RNA Quant: <20 copies/mL — AB
HIV-1 RNA QUANT, LOG: DETECTED {Log_copies}/mL — AB

## 2017-01-30 ENCOUNTER — Telehealth: Payer: Self-pay | Admitting: Pharmacist

## 2017-01-30 MED FILL — COLCRYS 0.6 MG TABLET: 0.6 | 30 days supply | Qty: 15 | Fill #2

## 2017-01-30 MED FILL — DESCOVY 200-25 MG TABS: 200-25 | 30 days supply | Qty: 30 | Fill #5

## 2017-01-30 MED FILL — TIVICAY 50 MG TABLET: 50 | 30 days supply | Qty: 30 | Fill #5

## 2017-01-30 NOTE — Telephone Encounter (Signed)
Pt supposed to be on Prezcobix/Descovy/Tivicay. He told [facility] that he did not want a refill of his Prezcobix.  Per patient, he is having side effects that are intolerable. He defecated on himself three different nights and stopped the Prezcobix and it has since resolved.  Patient now refusing to take Prezcobix.  He sees Dr. Luciana Axe on 9/11. Dr. Luciana Axe advised.

## 2017-01-30 NOTE — Telephone Encounter (Signed)
Thanks.  I will discuss it with him.

## 2017-02-14 ENCOUNTER — Encounter: Payer: Self-pay | Admitting: Internal Medicine

## 2017-02-14 ENCOUNTER — Ambulatory Visit (INDEPENDENT_AMBULATORY_CARE_PROVIDER_SITE_OTHER): Payer: Medicare Other | Admitting: Internal Medicine

## 2017-02-14 VITALS — BP 168/107 | HR 80 | Temp 98.3°F | Wt 208.0 lb

## 2017-02-14 DIAGNOSIS — Z7189 Other specified counseling: Secondary | ICD-10-CM | POA: Diagnosis not present

## 2017-02-14 DIAGNOSIS — Z23 Encounter for immunization: Secondary | ICD-10-CM | POA: Insufficient documentation

## 2017-02-14 DIAGNOSIS — B20 Human immunodeficiency virus [HIV] disease: Secondary | ICD-10-CM | POA: Diagnosis present

## 2017-02-14 DIAGNOSIS — Z7185 Encounter for immunization safety counseling: Secondary | ICD-10-CM

## 2017-02-14 MED ORDER — DOLUTEGRAVIR SODIUM 50 MG PO TABS: 50.0000 mg | ORAL_TABLET | Freq: Every day | ORAL | 6 refills | Status: DC

## 2017-02-14 MED ORDER — EMTRICITAB-RILPIVIR-TENOFOV AF 200-25-25 MG PO TABS: 1.0000 | ORAL_TABLET | Freq: Every day | ORAL | 6 refills | Status: DC

## 2017-02-14 NOTE — Patient Instructions (Signed)
Keep taking Tivicay and Descovy for now.  We will switch you to Tivicay + Odefsey when you need a refill.  Please take this regimen with food at the same time every day.  We will see you in a month!

## 2017-02-14 NOTE — Progress Notes (Signed)
HPI: Chad Moses is a 56 y.o. male who presents to the [facility] clinic today to follow-up with Dr. Luciana Axeomer.   Allergies: No Known Allergies  Past Medical History: Past Medical History:  Diagnosis Date  . Blood transfusion without reported diagnosis   . Cortical visual impairment 2002  . Depression   . Diabetes mellitus without complication (HCC)   . Gout   . HIV disease (HCC)   . Hyperlipidemia   . Hypertension   . Stroke [facility](HCC) 2002    Social History: Social History   Social History  . Marital status: Divorced    Spouse name: N/A  . Number of children: N/A  . Years of education: N/A   Social History Main Topics  . Smoking status: Current Every Day Smoker    Packs/day: 0.10    Types: Cigarettes, Cigars  . Smokeless tobacco: Never Used     Comment: given quitline info  . Alcohol use No  . Drug use: No  . Sexual activity: Not Currently    Partners: Female, Male     Comment: pt. given condoms   Other Topics Concern  . None   Social History Narrative  . None    Current Regimen: Prezcobix + Tivicay + Descovy  Labs: HIV 1 RNA Quant (copies/mL)  Date Value  01/10/2017 <20 DETECTED (A)  10/12/2016 <20 DETECTED (A)  09/10/2015 <20   CD4 T Cell Abs (/uL)  Date Value  01/10/2017 580  08/15/2016 460  09/10/2015 540   Hep B S Ab (no units)  Date Value  01/17/2008 NEG   Hepatitis B Surface Ag (no units)  Date Value  01/17/2008 NEG   HCV Ab (no units)  Date Value  06/10/2014 NEGATIVE    CrCl: CrCl cannot be calculated (Patient's most recent lab result is older than the maximum 21 days allowed.).  Lipids:    Component Value Date/Time   CHOL 212 (H) 01/09/2017 1114   TRIG 161 (H) 01/09/2017 1114   HDL 30 (L) 01/09/2017 1114   CHOLHDL 7.1 (H) 01/09/2017 1114   VLDL 32 (H) 01/09/2017 1114   LDLCALC 150 (H) 01/09/2017 1114    Assessment: Chad Moses is here today to follow-up with Dr. Luciana Axeomer for his HIV infection.  He is currently taking Prezcobix +  Tivicay + Descovy but has refused to take his Prezcobix for the last several weeks due to intolerable side effects.  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  Discussed with Dr. Luciana Axeomer.  He could take Rilpivirine + Biktarvy or Tivicay + Odefsey. Spent time going over both of these regimens with the patient and he would like to take Tivicay + Odefsey because he is taking Tivicay now and likes the pill.  [facility]. I will have him continue his current regimen until he needs a refill in a week or two so his medications will be on the same refill schedule and he does not get confused. Will bring him back to see me in 1 month.   Plans: - Stop Prezcobix and Descovy - Continue Tivicay PO once daily with food - Start Odefsey PO once daily with food - F/u with me 10/15 at 11am  Bassam Dresch L. Chief Walkup, PharmD, CPP Infectious Diseases Clinical Pharmacist [facility] 02/14/2017, 11:14 AM

## 2017-02-14 NOTE — Progress Notes (Signed)
   Subjective:    Patient ID: Chad Moses, male    DOB: 07/05/1960, 56 y.o.   MRN: [id number]  HPI Here for follow up of HIV Has been intermittently in care and I last saw him in April 2017.  He saw PharmD in March this year and it was discovered he had ran out of Tivicay refills about 3 months prior and was taking Descovy alone.  Unfortunately he developed a 184V mutation and he was started on Tivicay, Descovy and Prezcobix.  Unfortuantley he felt the prescobix made him defecate on himself while asleep so has been off since.  No weight loss, no associated n/v/d.  No rashes.    Review of Systems  Constitutional: Negative for fatigue.  Gastrointestinal: Negative for diarrhea and nausea.  Skin: Negative for rash.       Objective:   Physical Exam  Constitutional: He appears well-developed and well-nourished. No distress.  Eyes: No scleral icterus.  Cardiovascular: Normal rate, regular rhythm and normal heart sounds.   No murmur heard. Pulmonary/Chest: Effort normal and breath sounds normal. No respiratory distress.  Lymphadenopathy:    He has no cervical adenopathy.  Skin: No rash noted.    SH: + tobacco      Assessment & Plan:

## 2017-02-14 NOTE — Assessment & Plan Note (Signed)
Refused flu shot, counseled

## 2017-02-14 NOTE — Assessment & Plan Note (Addendum)
Established problem that is worsening.  I had a long discussion with him on the importance of a full regimen and his resistance mutation.  Will use Odefsey and Tivicay and he will rtc in 4 weeks with CK.  Hopefully he can stay on a full regimen

## 2017-02-27 MED FILL — TIVICAY 50 MG TABLET: 50 | 30 days supply | Qty: 30 | Fill #0

## 2017-02-27 MED FILL — ODEFSEY 200-25-25 MG TABS: 200-25-25 | 30 days supply | Qty: 30 | Fill #0

## 2017-03-20 ENCOUNTER — Ambulatory Visit (INDEPENDENT_AMBULATORY_CARE_PROVIDER_SITE_OTHER): Payer: Medicare Other | Admitting: Pharmacist

## 2017-03-20 DIAGNOSIS — M109 Gout, unspecified: Secondary | ICD-10-CM

## 2017-03-20 DIAGNOSIS — B20 Human immunodeficiency virus [HIV] disease: Secondary | ICD-10-CM

## 2017-03-20 MED ORDER — COLCHICINE 0.6 MG PO TABS: 0.6000 mg | ORAL_TABLET | Freq: Every day | ORAL | 3 refills | Status: DC

## 2017-03-20 MED ORDER — TRIAMCINOLONE ACETONIDE 0.1 % EX OINT: TOPICAL_OINTMENT | Freq: Two times a day (BID) | CUTANEOUS | 1 refills | Status: DC

## 2017-03-20 NOTE — Progress Notes (Signed)
HPI: Chad Moses is a 56 y.o. male who presents to the [facility] pharmacy clinic for HIV follow-up.  Allergies: No Known Allergies  Past Medical History: Past Medical History:  Diagnosis Date  . Blood transfusion without reported diagnosis   . Cortical visual impairment 2002  . Depression   . Diabetes mellitus without complication (HCC)   . Gout   . HIV disease (HCC)   . Hyperlipidemia   . Hypertension   . Stroke [facility]) 2002    Social History: Social History   Social History  . Marital status: Divorced    Spouse name: N/A  . Number of children: N/A  . Years of education: N/A   Social History Main Topics  . Smoking status: Current Every Day Smoker    Packs/day: 0.10    Types: Cigarettes, Cigars  . Smokeless tobacco: Never Used     Comment: given quitline info  . Alcohol use No  . Drug use: No  . Sexual activity: Not Currently    Partners: Female, Male     Comment: pt. given condoms   Other Topics Concern  . Not on file   Social History Narrative  . No narrative on file    Current Regimen: Odefsey + Tivicay  Labs: HIV 1 RNA Quant (copies/mL)  Date Value  01/10/2017 <20 DETECTED (A)  10/12/2016 <20 DETECTED (A)  09/10/2015 <20   CD4 T Cell Abs (/uL)  Date Value  01/10/2017 580  08/15/2016 460  09/10/2015 540   Hep B S Ab (no units)  Date Value  01/17/2008 NEG   Hepatitis B Surface Ag (no units)  Date Value  01/17/2008 NEG   HCV Ab (no units)  Date Value  06/10/2014 NEGATIVE    CrCl: CrCl cannot be calculated (Patient's most recent lab result is older than the maximum 21 days allowed.).  Lipids:    Component Value Date/Time   CHOL 212 (H) 01/09/2017 1114   TRIG 161 (H) 01/09/2017 1114   HDL 30 (L) 01/09/2017 1114   CHOLHDL 7.1 (H) 01/09/2017 1114   VLDL 32 (H) 01/09/2017 1114   LDLCALC 150 (H) 01/09/2017 1114    Assessment: Chad Moses is here today to follow-up with pharmacy after recently switching medications for HIV.  He was on  Prezcobix + Tivicay + Descovy but could not tolerate the Prezcobix.  He saw Dr. Luciana Axe ~1 month ago and was switched to [facility] + Tivicay. He states he loves his new regimen. He is having no issues tolerating the [facility] or Tivicay and likes that they are so small.  He takes them both in the morning around 9am with breakfast. He hasn't missed a single dose since starting.  He brought in his medication list today, and I updated our list in epic. I will get a viral load today to make sure he is still suppressed. I will make a f/u appt for Dr. Luciana Axe in 6 months.  Of note, he is complaining of a severe gout attack today.  He is walking with a cane and limping due to his foot.  He ran out of colchicine, so I sent in a refill for him.  Plans: - Continue Tivicay and Odefsey - HIV RNA today - Refill colchicine - F/u with Dr. Luciana Axe 09/19/17 at 1030am  Cassie L. Kuppelweiser, PharmD, CPP Infectious Diseases Clinical Pharmacist [facility] 03/20/2017, 10:58 AM

## 2017-03-22 LAB — HIV-1 RNA QUANT-NO REFLEX-BLD
HIV 1 RNA QUANT: 31 {copies}/mL — AB
HIV-1 RNA QUANT, LOG: 1.49 {Log_copies}/mL — AB

## 2017-03-23 ENCOUNTER — Other Ambulatory Visit: Payer: Self-pay | Admitting: Pharmacist Clinician (PhC)/ Clinical Pharmacy Specialist

## 2017-03-23 ENCOUNTER — Other Ambulatory Visit: Payer: Self-pay | Admitting: Pharmacist

## 2017-03-23 DIAGNOSIS — M109 Gout, unspecified: Secondary | ICD-10-CM

## 2017-03-23 MED ORDER — COLCHICINE 0.6 MG PO TABS: 0.6000 mg | ORAL_TABLET | Freq: Every day | ORAL | 3 refills | Status: DC

## 2017-03-23 MED FILL — ODEFSEY 200-25-25 MG TABS: 200-25-25 | 30 days supply | Qty: 30 | Fill #1

## 2017-03-23 MED FILL — COLCRYS 0.6 MG TABLET: 0.6 | 30 days supply | Qty: 30 | Fill #0

## 2017-03-23 MED FILL — TIVICAY 50 MG TABLET: 50 | 30 days supply | Qty: 30 | Fill #1

## 2017-04-21 MED FILL — ODEFSEY 200-25-25 MG TABS: 200-25-25 | 30 days supply | Qty: 30 | Fill #2

## 2017-04-21 MED FILL — TIVICAY 50 MG TABLET: 50 | 30 days supply | Qty: 30 | Fill #2

## 2017-04-21 MED FILL — COLCRYS 0.6 MG TABLET: 0.6 | 30 days supply | Qty: 30 | Fill #1

## 2017-05-03 ENCOUNTER — Other Ambulatory Visit: Payer: Self-pay | Admitting: Family Medicine

## 2017-05-19 MED FILL — COLCRYS 0.6 MG TABLET: 0.6 | 30 days supply | Qty: 30 | Fill #2

## 2017-05-19 MED FILL — ODEFSEY 200-25-25 MG TABS: 200-25-25 | 30 days supply | Qty: 30 | Fill #3

## 2017-05-19 MED FILL — TIVICAY 50 MG TABLET: 50 | 30 days supply | Qty: 30 | Fill #3

## 2017-06-01 ENCOUNTER — Other Ambulatory Visit: Payer: Self-pay | Admitting: Family Medicine

## 2017-06-16 ENCOUNTER — Other Ambulatory Visit: Payer: Self-pay | Admitting: Pharmacist

## 2017-06-16 DIAGNOSIS — B20 Human immunodeficiency virus [HIV] disease: Secondary | ICD-10-CM

## 2017-06-16 DIAGNOSIS — M109 Gout, unspecified: Secondary | ICD-10-CM

## 2017-06-16 MED ORDER — EMTRICITAB-RILPIVIR-TENOFOV AF 200-25-25 MG PO TABS: 1.0000 | ORAL_TABLET | Freq: Every day | ORAL | 5 refills | Status: DC

## 2017-06-16 MED ORDER — DOLUTEGRAVIR SODIUM 50 MG PO TABS: 50.0000 mg | ORAL_TABLET | Freq: Every day | ORAL | 5 refills | Status: DC

## 2017-06-16 MED ORDER — COLCHICINE 0.6 MG PO TABS: 0.6000 mg | ORAL_TABLET | Freq: Every day | ORAL | 5 refills | Status: DC

## 2017-06-16 MED FILL — COLCHICINE 0.6 MG TABS: 0.6 | 30 days supply | Qty: 30 | Fill #0

## 2017-06-16 MED FILL — ODEFSEY 200-25-25 MG TABS: 200-25-25 | 30 days supply | Qty: 30 | Fill #0

## 2017-06-16 MED FILL — TIVICAY 50 MG TABLET: 50 | 30 days supply | Qty: 30 | Fill #0

## 2017-07-01 ENCOUNTER — Other Ambulatory Visit: Payer: Self-pay | Admitting: Family Medicine

## 2017-07-03 ENCOUNTER — Other Ambulatory Visit: Payer: Self-pay

## 2017-07-03 ENCOUNTER — Other Ambulatory Visit: Payer: Self-pay | Admitting: Family Medicine

## 2017-07-03 ENCOUNTER — Telehealth: Payer: Self-pay

## 2017-07-03 DIAGNOSIS — I10 Essential (primary) hypertension: Secondary | ICD-10-CM

## 2017-07-03 MED ORDER — PRAVASTATIN SODIUM 40 MG PO TABS: 40.0000 mg | ORAL_TABLET | Freq: Every day | ORAL | 0 refills | Status: DC

## 2017-07-03 NOTE — Telephone Encounter (Signed)
Received a fax from [facility] stating that the patient had received a 30 day supply on Colchicine 0.6 mg TAB. I contacted [facility], and was able to talk to a representative Clydie BraunKaren who stated that Colchicine was not on the formulary of covered medication, and that the patient would have to be prescribed Colcrys since it was under the coved formulary. I stated that I would inform the prescriber of this. Lorenso CourierJose L Anissa Abbs, [place]

## 2017-07-04 NOTE — Telephone Encounter (Signed)
He should talk to his primary care physician who is managing his gout on the appropriateness of this medication for him.   Colcrys is the same medication as colchicine.   thanks

## 2017-07-07 NOTE — Telephone Encounter (Signed)
Left a voicemail for the patient to call his PCP about his medication. Lorenso CourierJose L Maldonado, [place]

## 2017-07-12 ENCOUNTER — Ambulatory Visit (INDEPENDENT_AMBULATORY_CARE_PROVIDER_SITE_OTHER): Payer: Medicare Other | Admitting: Family Medicine

## 2017-07-12 ENCOUNTER — Encounter: Payer: Self-pay | Admitting: Family Medicine

## 2017-07-12 VITALS — BP 150/90 | HR 84 | Temp 97.9°F | Resp 14 | Ht 71.0 in | Wt 202.0 lb

## 2017-07-12 DIAGNOSIS — I10 Essential (primary) hypertension: Secondary | ICD-10-CM

## 2017-07-12 DIAGNOSIS — E119 Type 2 diabetes mellitus without complications: Secondary | ICD-10-CM

## 2017-07-12 DIAGNOSIS — M109 Gout, unspecified: Secondary | ICD-10-CM | POA: Diagnosis not present

## 2017-07-12 LAB — POCT URINALYSIS DIP (DEVICE)
Bilirubin Urine: NEGATIVE
Glucose, UA: NEGATIVE mg/dL
KETONES UR: NEGATIVE mg/dL
Nitrite: NEGATIVE
PH: 5.5 (ref 5.0–8.0)
PROTEIN: NEGATIVE mg/dL
Specific Gravity, Urine: 1.015 (ref 1.005–1.030)
Urobilinogen, UA: 0.2 mg/dL (ref 0.0–1.0)

## 2017-07-12 MED ORDER — PREDNISONE 20 MG PO TABS: 60.0000 mg | ORAL_TABLET | Freq: Every day | ORAL | 0 refills | Status: AC

## 2017-07-12 MED ORDER — COLCHICINE 0.6 MG PO TABS: 0.6000 mg | ORAL_TABLET | Freq: Every day | ORAL | 2 refills | Status: AC

## 2017-07-12 MED ORDER — METHYLPREDNISOLONE SODIUM SUCC 125 MG IJ SOLR
80.0000 mg | Freq: Once | INTRAMUSCULAR | Status: AC
  Administered 2017-07-12: 80 mg via INTRAMUSCULAR

## 2017-07-12 NOTE — Patient Instructions (Signed)
Continue colchine 0.6 mg once daily as ordered. Start prednisone 60 mg once daily as breakfast.

## 2017-07-12 NOTE — Progress Notes (Signed)
Patient ID: Chad Moses, male    DOB: Nov 19, 1960, 57 y.o.   MRN: [id number]  PCP: Bing Neighbors, FNP  Chief Complaint  Patient presents with  . Follow-up    HTN,   . Foot Pain    left  . Hand Pain    right    Subjective:  HPI Chad Moses is a 57 y.o. male with a history of HIV, Hypertension, GOUT, CVA, hyperlipidemia, presents for evaluation of chronic condition management.   Gout Attack Chad Moses was last seen in office 01/09/2017 for chronic condition management. Only complaint today is right hand pain and swelling from recent gout flare. Gout flare initially started in left foot , however has resolved. Right hand continues to remain painful. Currently is wearing a right hand brace to immobilize hand and wrists. Reports pain is consistent with prior gout flares. He chronically takes colchicine 0.6 mg daily. Reports recently consuming large amount of seafood which he didn't know could precipitate an episode of gout. He reports no recent fall or injury sustained to right hand.  Chronic conditions Chad Moses reports no home monitoring of blood pressure. He has a history of CVA and occasionally reports experiencing mild headaches as a residual effect of stroke. Reports compliance with medication regimen. Unfortunately he continues to be a 1/2 pack per day smoker. Reports efforts to adhere to low sodium diet. Chad Moses suffers from diabetes. Last A1C 6.4 six months prior. Denies any neuropathic pain, polyphagia, polydipsia, or polyuria. He denies any episodes of dizziness, shortness of breath, or chest pain. Social History   Socioeconomic History  . Marital status: Divorced    Spouse name: Not on file  . Number of children: Not on file  . Years of education: Not on file  . Highest education level: Not on file  Social Needs  . Financial resource strain: Not on file  . Food insecurity - worry: Not on file  . Food insecurity - inability: Not on file  . Transportation needs - medical:  Not on file  . Transportation needs - non-medical: Not on file  Occupational History  . Not on file  Tobacco Use  . Smoking status: Current Every Day Smoker    Packs/day: 0.10    Types: Cigarettes, Cigars  . Smokeless tobacco: Never Used  . Tobacco comment: given quitline info  Substance and Sexual Activity  . Alcohol use: No    Alcohol/week: 0.0 oz  . Drug use: No  . Sexual activity: Not Currently    Partners: Female, Male    Comment: pt. given condoms  Other Topics Concern  . Not on file  Social History Narrative  . Not on file   Family History  Problem Relation Age of Onset  . Diabetes Mother   . Hypertension Mother   . Stroke Brother   . Cancer Brother   . Colon cancer Neg Hx   . Esophageal cancer Neg Hx   . Rectal cancer Neg Hx   . Stomach cancer Neg Hx    Review of Systems  Constitutional: Negative.   Respiratory: Negative.   Cardiovascular: Negative.   Musculoskeletal: Positive for arthralgias.  Neurological: Negative.   Psychiatric/Behavioral: Negative.     Patient Active Problem List   Diagnosis Date Noted  . Vaccine counseling 02/14/2017  . Tendinitis 09/24/2015  . Acute gouty arthritis 06/16/2014  . Depression 06/10/2014  . ASCVD (arteriosclerotic cardiovascular disease) 06/09/2014  . Elevated lipids 06/09/2014  . Diabetes mellitus type 2 in nonobese (HCC) 06/09/2014  .  Cortical visual impairment   . Stroke (HCC)   . Peripheral vascular disease (HCC) 04/16/2014  . Left foot pain 04/15/2014  . Blurred vision, left eye 02/19/2014  . Need for Tdap vaccination 02/19/2014  . Need for prophylactic vaccination and inoculation against influenza 02/19/2014  . Encounter for long-term (current) use of medications 09/17/2013  . Screening examination for venereal disease 09/17/2013  . Rash and nonspecific skin eruption 03/05/2013  . Tobacco abuse 11/01/2012  . Vision blurred 06/19/2012  . Chronic renal insufficiency, stage II (mild) 11/19/2011  . Routine  health maintenance 07/26/2011  . KNEE PAIN, LEFT 05/28/2009  . ONYCHOMYCOSIS, TOENAILS 01/15/2009  . Pain, dental 07/21/2008  . Human immunodeficiency virus (HIV) disease (HCC) 04/14/2008  . Hyperlipidemia LDL goal <130 04/14/2008  . Gout 04/14/2008  . Essential hypertension 04/14/2008    No Known Allergies  Prior to Admission medications   Medication Sig Start Date End Date Taking? Authorizing Provider  aspirin 81 MG tablet Take 1 tablet (81 mg total) by mouth daily. 06/11/13  Yes Comer, Belia Hemanobert W, MD  atenolol (TENORMIN) 100 MG tablet Take 1 tablet (100 mg total) by mouth daily. 01/09/17  Yes Bing NeighborsHarris, Cathryne Mancebo S, FNP  colchicine 0.6 MG tablet Take 1 tablet (0.6 mg total) by mouth daily. 06/16/17  Yes Comer, Belia Hemanobert W, MD  dolutegravir (TIVICAY) 50 MG tablet Take 1 tablet (50 mg total) by mouth daily with breakfast. 06/16/17  Yes Comer, Belia Hemanobert W, MD  emtricitabine-rilpivir-tenofovir AF (ODEFSEY) 200-25-25 MG TABS tablet Take 1 tablet by mouth daily with breakfast. 06/16/17  Yes Comer, Belia Hemanobert W, MD  enalapril (VASOTEC) 20 MG tablet TAKE 1 TABLET(20 MG) BY MOUTH DAILY 07/03/17  Yes Bing NeighborsHarris, Myan Locatelli S, FNP  metFORMIN (GLUCOPHAGE) 500 MG tablet TAKE 1 TABLET BY MOUTH TWICE DAILY WITH MEAL 07/03/17  Yes Bing NeighborsHarris, Olufemi Mofield S, FNP  pravastatin (PRAVACHOL) 40 MG tablet Take 1 tablet (40 mg total) by mouth daily. 07/03/17  Yes Bing NeighborsHarris, Isay Perleberg S, FNP  ketoconazole (NIZORAL) 2 % cream Apply 1 application topically 2 (two) times daily. Patient not taking: Reported on 07/12/2017 07/04/16   Henrietta HooverBernhardt, Linda C, NP  triamcinolone ointment (KENALOG) 0.1 % Apply topically 2 (two) times daily. Patient not taking: Reported on 07/12/2017 03/20/17   Kuppelweiser, Cherylann Ratelassie L, RPH-CPP    Past Medical, Surgical Family and Social History reviewed and updated.    Objective:   Today's Vitals   07/12/17 1029  BP: (!) 150/90  Pulse: 84  Resp: 14  Temp: 97.9 F (36.6 C)  TempSrc: Oral  SpO2: 100%  Weight: 202 lb (91.6 kg)   Height: 5\' 11"  (1.803 m)    Wt Readings from Last 3 Encounters:  07/12/17 202 lb (91.6 kg)  02/14/17 208 lb (94.3 kg)  01/09/17 206 lb (93.4 kg)    Physical Exam Constitutional: He is oriented to person, place, and time. He appears well-developed and well-nourished.  HENT:  Head: Normocephalic and atraumatic.  Eyes: Pupils are equal, round, and reactive to light. Conjunctivae are normal.  Neck: Normal range of motion. Neck supple.  Cardiovascular: Normal rate, regular rhythm, normal heart sounds and intact distal pulses.   Pulmonary/Chest: Effort normal and breath sounds normal.  Abdominal: Soft. Bowel sounds are normal.  Musculoskeletal: Normal range of motion.  Neurological: He is alert and oriented to person, place, and time. Gait instability (chronic) secondary to CVA  Skin: Skin is warm and dry.  Psychiatric: He has a normal mood and affect. His behavior is normal. Judgment and thought content  normal.    Assessment & Plan:  1. Essential hypertension, uncontrolled reading. He has not taken any medication prior to today's visit.  Will check comprehensive metabolic panel today to evaluation electrolyte and renal function status. We have discussed target BP range and blood pressure goal. I have advised patient to check BP regularly and to call us back or report to clinic if the numbers are consistently higher than 140/90. We discussed the importance of compliance with medical therapy and DASH diet recommended, consequences of uncontrolled hypertension discussed.   2. Diabetes mellitus type 2 in nonobese (HCC), A1C 6.9, slightly increased from prior 6.5. No medication changes at present. Encouraged better dietary selection choices, physical activity as tolerated. Continue consistently taking Metformin 500 mg twice daily with food.    3. Acute gout of right foot, unspecified cause 4. Acute gout of right hand, unspecified cause Hx of gout. Will check a uric acid level. Trial a short  course of prednisone 60 mg once daily x 5 days. He will resume colchicine 0.6 mg once daily. Will administer 1 dose of  methylPREDNISolone sodium succinate (SOLU-MEDROL) 125 mg/2 mL injection 80 mg, IM here in office.  Patient will fax or drop of paperwork to sign off on for personal care attendant in home to assist with ADLs.   Meds ordered this encounter  Medications  . predniSONE (DELTASONE) 20 MG tablet    Sig: Take 3 tablets (60 mg total) by mouth daily with breakfast for 5 days.    Dispense:  15 tablet    Refill:  0    Order Specific Question:   Supervising Provider    Answer:   Quentin Angst [id number]  . colchicine 0.6 MG tablet    Sig: Take 1 tablet (0.6 mg total) by mouth daily.    Dispense:  90 tablet    Refill:  2    Order Specific Question:   Supervising Provider    Answer:   Quentin Angst [id number]  . methylPREDNISolone sodium succinate (SOLU-MEDROL) 125 mg/2 mL injection 80 mg    Orders Placed This Encounter  Procedures  . Uric Acid  . Comprehensive metabolic panel  . CBC with Differential  . Hemoglobin A1c  . POCT urinalysis dip (device)    RTC: 6 months for chronic condition management.     Godfrey Pick. Tiburcio Pea, MSN, FNP-C [facility]  [address] [place], [place] [address] [phone]

## 2017-07-13 LAB — CBC WITH DIFFERENTIAL/PLATELET
Basophils Absolute: 0 10*3/uL (ref 0.0–0.2)
Basos: 1 %
EOS (ABSOLUTE): 0.3 10*3/uL (ref 0.0–0.4)
EOS: 5 %
HEMATOCRIT: 45.5 % (ref 37.5–51.0)
Hemoglobin: 15.1 g/dL (ref 13.0–17.7)
IMMATURE GRANULOCYTES: 0 %
Immature Grans (Abs): 0 10*3/uL (ref 0.0–0.1)
LYMPHS ABS: 2.1 10*3/uL (ref 0.7–3.1)
Lymphs: 39 %
MCH: 28.7 pg (ref 26.6–33.0)
MCHC: 33.2 g/dL (ref 31.5–35.7)
MCV: 86 fL (ref 79–97)
MONOS ABS: 0.4 10*3/uL (ref 0.1–0.9)
Monocytes: 7 %
NEUTROS PCT: 48 %
Neutrophils Absolute: 2.7 10*3/uL (ref 1.4–7.0)
Platelets: 224 10*3/uL (ref 150–379)
RBC: 5.27 x10E6/uL (ref 4.14–5.80)
RDW: 15.5 % — AB (ref 12.3–15.4)
WBC: 5.6 10*3/uL (ref 3.4–10.8)

## 2017-07-13 LAB — COMPREHENSIVE METABOLIC PANEL
ALBUMIN: 4.4 g/dL (ref 3.5–5.5)
ALT: 37 IU/L (ref 0–44)
AST: 25 IU/L (ref 0–40)
Albumin/Globulin Ratio: 1.3 (ref 1.2–2.2)
Alkaline Phosphatase: 123 IU/L — ABNORMAL HIGH (ref 39–117)
BUN / CREAT RATIO: 9 (ref 9–20)
BUN: 15 mg/dL (ref 6–24)
Bilirubin Total: 0.3 mg/dL (ref 0.0–1.2)
CALCIUM: 9.9 mg/dL (ref 8.7–10.2)
CO2: 23 mmol/L (ref 20–29)
CREATININE: 1.63 mg/dL — AB (ref 0.76–1.27)
Chloride: 104 mmol/L (ref 96–106)
GFR calc Af Amer: 54 mL/min/{1.73_m2} — ABNORMAL LOW (ref >59)
GFR calc non Af Amer: 47 mL/min/{1.73_m2} — ABNORMAL LOW (ref >59)
GLOBULIN, TOTAL: 3.5 g/dL (ref 1.5–4.5)
Glucose: 115 mg/dL — ABNORMAL HIGH (ref 65–99)
Potassium: 4.8 mmol/L (ref 3.5–5.2)
SODIUM: 141 mmol/L (ref 134–144)
Total Protein: 7.9 g/dL (ref 6.0–8.5)

## 2017-07-13 LAB — HEMOGLOBIN A1C
ESTIMATED AVERAGE GLUCOSE: 151 mg/dL
Hgb A1c MFr Bld: 6.9 % — ABNORMAL HIGH (ref 4.8–5.6)

## 2017-07-13 LAB — URIC ACID: Uric Acid: 9.8 mg/dL — ABNORMAL HIGH (ref 3.7–8.6)

## 2017-07-21 MED FILL — TIVICAY 50 MG TABLET: 50 | 30 days supply | Qty: 30 | Fill #1

## 2017-07-21 MED FILL — ODEFSEY 200-25-25 MG TABS: 200-25-25 | 30 days supply | Qty: 30 | Fill #1

## 2017-08-05 ENCOUNTER — Other Ambulatory Visit: Payer: Self-pay | Admitting: Family Medicine

## 2017-08-07 MED ORDER — METFORMIN HCL 500 MG PO TABS: ORAL_TABLET | ORAL | 0 refills | Status: DC

## 2017-08-07 NOTE — Telephone Encounter (Signed)
Refilled metformin 90 day supply

## 2017-08-07 NOTE — Addendum Note (Signed)
Addended by: Bing NeighborsHARRIS, Jakelin Taussig S on: 08/07/2017 07:41 AM   Modules accepted: Orders

## 2017-08-17 MED FILL — ODEFSEY 200-25-25 MG TABS: 200-25-25 | 30 days supply | Qty: 30 | Fill #2

## 2017-08-17 MED FILL — TIVICAY 50 MG TABLET: 50 | 30 days supply | Qty: 30 | Fill #2

## 2017-09-19 ENCOUNTER — Other Ambulatory Visit (HOSPITAL_COMMUNITY)
Admission: RE | Admit: 2017-09-19 | Discharge: 2017-09-19 | Disposition: A | Discharge status: Home / Self Care | Payer: Medicare Other | Source: Ambulatory Visit | Attending: Internal Medicine | Admitting: Internal Medicine

## 2017-09-19 ENCOUNTER — Ambulatory Visit (INDEPENDENT_AMBULATORY_CARE_PROVIDER_SITE_OTHER): Payer: Medicare Other | Admitting: Internal Medicine

## 2017-09-19 ENCOUNTER — Encounter: Payer: Self-pay | Admitting: Internal Medicine

## 2017-09-19 VITALS — BP 159/87 | HR 80 | Temp 97.3°F | Resp 18 | Ht 71.0 in | Wt 190.0 lb

## 2017-09-19 DIAGNOSIS — Z7185 Encounter for immunization safety counseling: Secondary | ICD-10-CM

## 2017-09-19 DIAGNOSIS — Z7189 Other specified counseling: Secondary | ICD-10-CM | POA: Diagnosis not present

## 2017-09-19 DIAGNOSIS — Z23 Encounter for immunization: Secondary | ICD-10-CM

## 2017-09-19 DIAGNOSIS — Z720 Tobacco use: Secondary | ICD-10-CM

## 2017-09-19 DIAGNOSIS — Z113 Encounter for screening for infections with a predominantly sexual mode of transmission: Secondary | ICD-10-CM | POA: Insufficient documentation

## 2017-09-19 DIAGNOSIS — B20 Human immunodeficiency virus [HIV] disease: Secondary | ICD-10-CM | POA: Diagnosis not present

## 2017-09-19 MED ORDER — DOLUTEGRAVIR SODIUM 50 MG PO TABS: 50.0000 mg | ORAL_TABLET | Freq: Every day | ORAL | 11 refills | Status: AC

## 2017-09-19 MED ORDER — EMTRICITAB-RILPIVIR-TENOFOV AF 200-25-25 MG PO TABS: 1.0000 | ORAL_TABLET | Freq: Every day | ORAL | 11 refills | Status: AC

## 2017-09-19 MED ORDER — HEPATITIS B VAC RECOMBINANT 10 MCG/0.5ML IJ SUSP
0.5000 mL | Freq: Once | INTRAMUSCULAR | Status: AC
  Administered 2017-09-19: 0.5 mL via INTRAMUSCULAR

## 2017-09-19 MED ORDER — TRIAMCINOLONE ACETONIDE 0.1 % EX OINT: TOPICAL_OINTMENT | Freq: Two times a day (BID) | CUTANEOUS | 1 refills | Status: AC

## 2017-09-19 MED ORDER — HEPATITIS A VACCINE 1440 EL U/ML IM SUSP: 1.0000 mL | Freq: Once | INTRAMUSCULAR | 0 refills | Status: AC

## 2017-09-20 LAB — T-HELPER CELL (CD4) - (RCID CLINIC ONLY)
CD4 % Helper T Cell: 18 % — ABNORMAL LOW (ref 33–55)
CD4 T CELL ABS: 510 /uL (ref 400–2700)

## 2017-09-20 LAB — URINE CYTOLOGY ANCILLARY ONLY
Chlamydia: NEGATIVE
Neisseria Gonorrhea: NEGATIVE

## 2017-09-20 LAB — RPR: RPR: NONREACTIVE

## 2017-09-20 NOTE — Progress Notes (Signed)
   Subjective:    Patient ID: Grayland Jackimothy Trudel, male    DOB: 09/21/1960, 57 y.o.   MRN: [id number]  HPI Here for follow up of HIV He had been under sporadic care and developed a 184V mutation while taking an incomplete regimen and now has been well controlled on Odefsey and Tivicay.  He denies any missed doses.  Having issues with his knee and being followed by his PCP.  No missed doses now.  No associated n/v or rash.     Review of Systems  Constitutional: Negative for fatigue.  Gastrointestinal: Negative for diarrhea.  Skin: Negative for rash.       Objective:   Physical Exam  Constitutional: He appears well-developed and well-nourished. No distress.  Eyes: No scleral icterus.  Cardiovascular: Normal rate, regular rhythm and normal heart sounds.  No murmur heard. Pulmonary/Chest: Effort normal and breath sounds normal. No respiratory distress.  Skin: No rash noted.   SH: + tobacco       Assessment & Plan:

## 2017-09-20 NOTE — Assessment & Plan Note (Signed)
Given info on quitline

## 2017-09-20 NOTE — Assessment & Plan Note (Signed)
Due for pneumococcal today

## 2017-09-20 NOTE — Assessment & Plan Note (Signed)
Will screen today 

## 2017-09-20 NOTE — Assessment & Plan Note (Signed)
Has been doing well and will recheck labs today.  If ok, can rtc 6 months.

## 2017-09-21 LAB — HIV-1 RNA ULTRAQUANT REFLEX TO GENTYP+
HIV 1 RNA QUANT: 25 {copies}/mL — AB
HIV-1 RNA QUANT, LOG: 1.39 {Log_copies}/mL — AB

## 2017-10-04 ENCOUNTER — Other Ambulatory Visit: Payer: Self-pay | Admitting: Family Medicine

## 2017-10-05 ENCOUNTER — Other Ambulatory Visit: Payer: Self-pay | Admitting: Family Medicine

## 2017-10-05 DIAGNOSIS — I10 Essential (primary) hypertension: Secondary | ICD-10-CM

## 2017-10-23 MED FILL — TIVICAY 50 MG TABLET: 50 | 30 days supply | Qty: 30 | Fill #0

## 2017-10-23 MED FILL — ODEFSEY 200-25-25 MG TABS: 200-25-25 | 30 days supply | Qty: 30 | Fill #0

## 2017-11-24 MED FILL — TIVICAY 50 MG TABLET: 50 | 30 days supply | Qty: 30 | Fill #1

## 2017-11-24 MED FILL — ODEFSEY 200-25-25 MG TABS: 200-25-25 | 30 days supply | Qty: 30 | Fill #1

## 2017-12-17 ENCOUNTER — Other Ambulatory Visit: Payer: Self-pay | Admitting: Family Medicine

## 2017-12-18 ENCOUNTER — Other Ambulatory Visit: Payer: Self-pay | Admitting: Family Medicine

## 2017-12-20 MED FILL — TIVICAY 50 MG TABLET: 50 | 30 days supply | Qty: 30 | Fill #2

## 2017-12-20 MED FILL — ODEFSEY 200-25-25 MG TABS: 200-25-25 | 30 days supply | Qty: 30 | Fill #2

## 2018-01-10 ENCOUNTER — Ambulatory Visit: Payer: Medicare Other | Admitting: Family Medicine

## 2018-01-16 MED FILL — COLCRYS 0.6 MG TABLET: 0.6 | 30 days supply | Qty: 30 | Fill #1

## 2018-01-18 MED FILL — TIVICAY 50 MG TABLET: 50 | 30 days supply | Qty: 30 | Fill #3

## 2018-01-18 MED FILL — ODEFSEY 200-25-25 MG TABS: 200-25-25 | 30 days supply | Qty: 30 | Fill #3

## 2018-02-07 ENCOUNTER — Ambulatory Visit (INDEPENDENT_AMBULATORY_CARE_PROVIDER_SITE_OTHER): Payer: Medicare Other | Admitting: Family Medicine

## 2018-02-07 ENCOUNTER — Encounter: Payer: Self-pay | Admitting: Family Medicine

## 2018-02-07 VITALS — BP 142/70 | HR 80 | Temp 98.4°F | Ht 71.0 in | Wt 202.2 lb

## 2018-02-07 DIAGNOSIS — B20 Human immunodeficiency virus [HIV] disease: Secondary | ICD-10-CM | POA: Diagnosis not present

## 2018-02-07 DIAGNOSIS — I10 Essential (primary) hypertension: Secondary | ICD-10-CM

## 2018-02-07 DIAGNOSIS — Z720 Tobacco use: Secondary | ICD-10-CM | POA: Diagnosis not present

## 2018-02-07 DIAGNOSIS — Z716 Tobacco abuse counseling: Secondary | ICD-10-CM | POA: Diagnosis not present

## 2018-02-07 DIAGNOSIS — E119 Type 2 diabetes mellitus without complications: Secondary | ICD-10-CM

## 2018-02-07 DIAGNOSIS — M10072 Idiopathic gout, left ankle and foot: Secondary | ICD-10-CM | POA: Diagnosis not present

## 2018-02-07 DIAGNOSIS — J302 Other seasonal allergic rhinitis: Secondary | ICD-10-CM

## 2018-02-07 DIAGNOSIS — Z09 Encounter for follow-up examination after completed treatment for conditions other than malignant neoplasm: Secondary | ICD-10-CM

## 2018-02-07 LAB — POCT GLYCOSYLATED HEMOGLOBIN (HGB A1C): Hemoglobin A1C: 7.1 % — AB (ref 4.0–5.6)

## 2018-02-07 MED ORDER — CETIRIZINE HCL 10 MG PO TABS: 10.0000 mg | ORAL_TABLET | Freq: Every day | ORAL | 11 refills | Status: AC

## 2018-02-07 NOTE — Progress Notes (Signed)
Follow Up  Subjective:    Patient ID: Chad Moses, male    DOB: 1960-12-16, 57 y.o.   MRN: [id number]   Chief Complaint  Patient presents with  . Follow-up    chronic condition   HPI  Mrs. Karner is a 57 year old male with a past medical history of Stroke, Hypertension, Hyperlipidemia, HIV, Gout, Diabetes, and Depression. He is here today for follow up.  Current Status: Since his last office visit, he is doing well with no complaints. He has occasional gouty episodes involving his left ankle.   He denies fevers, chills, fatigue, recent infections, weight loss, and night sweats. He has not had any headaches, visual changes, dizziness, and falls.   No chest pain, heart palpitations, cough and shortness of breath reported.   No reports of GI problems such as nausea, vomiting, diarrhea, and constipation. He has no reports of blood in stools, dysuria and hematuria.   No depression or anxiety reported.    Past Medical History:  Diagnosis Date  . Blood transfusion without reported diagnosis   . Cortical visual impairment 2002  . Depression   . Diabetes mellitus without complication (HCC)   . Gout   . HIV disease (HCC)   . Hyperlipidemia   . Hypertension   . Stroke [facility]) 2002    Family History  Problem Relation Age of Onset  . Diabetes Mother   . Hypertension Mother   . Stroke Brother   . Cancer Brother   . Colon cancer Neg Hx   . Esophageal cancer Neg Hx   . Rectal cancer Neg Hx   . Stomach cancer Neg Hx     Social History   Socioeconomic History  . Marital status: Divorced    Spouse name: Not on file  . Number of children: Not on file  . Years of education: Not on file  . Highest education level: Not on file  Occupational History  . Not on file  Social Needs  . Financial resource strain: Not on file  . Food insecurity:    Worry: Not on file    Inability: Not on file  . Transportation needs:    Medical: Not on file    Non-medical: Not on file  Tobacco  Use  . Smoking status: Current Every Day Smoker    Packs/day: 0.10    Types: Cigarettes, Cigars  . Smokeless tobacco: Never Used  . Tobacco comment: given quitline info  Substance and Sexual Activity  . Alcohol use: No    Alcohol/week: 0.0 standard drinks  . Drug use: No  . Sexual activity: Not Currently    Partners: Female, Male    Comment: pt. given condoms  Lifestyle  . Physical activity:    Days per week: Not on file    Minutes per session: Not on file  . Stress: Not on file  Relationships  . Social connections:    Talks on phone: Not on file    Gets together: Not on file    Attends religious service: Not on file    Active member of club or organization: Not on file    Attends meetings of clubs or organizations: Not on file    Relationship status: Not on file  . Intimate partner violence:    Fear of current or ex partner: Not on file    Emotionally abused: Not on file    Physically abused: Not on file    Forced sexual activity: Not on file  Other Topics  Concern  . Not on file  Social History Narrative  . Not on file    Past Surgical History:  Procedure Laterality Date  . APPENDECTOMY    . TONSILLECTOMY      Immunization History  Administered Date(s) Administered  . Hepatitis B 07/26/2011, 08/23/2011, 06/19/2012  . Hepatitis B, ped/adol 09/19/2017  . Influenza,inj,Quad PF,6+ Mos 02/19/2014  . PPD Test 03/05/2010  . Pneumococcal Polysaccharide-23 10/05/2007, 11/01/2012, 09/19/2017  . Tdap 02/19/2014    Current Meds  Medication Sig  . aspirin 81 MG tablet Take 1 tablet (81 mg total) by mouth daily.  Marland Kitchen atenolol (TENORMIN) 100 MG tablet TAKE 1 TABLET(100 MG) BY MOUTH DAILY  . colchicine 0.6 MG tablet Take 1 tablet (0.6 mg total) by mouth daily.  . dolutegravir (TIVICAY) 50 MG tablet Take 1 tablet (50 mg total) by mouth daily with breakfast.  . emtricitabine-rilpivir-tenofovir AF (ODEFSEY) 200-25-25 MG TABS tablet Take 1 tablet by mouth daily with breakfast.   . enalapril (VASOTEC) 20 MG tablet TAKE 1 TABLET(20 MG) BY MOUTH DAILY  . ketoconazole (NIZORAL) 2 % cream Apply 1 application topically 2 (two) times daily.  . metFORMIN (GLUCOPHAGE) 500 MG tablet TAKE 1 TABLET BY MOUTH TWICE DAILY WITH A MEAL  . pravastatin (PRAVACHOL) 40 MG tablet TAKE 1 TABLET BY MOUTH DAILY  . triamcinolone ointment (KENALOG) 0.1 % Apply topically 2 (two) times daily.    No Known Allergies  BP (!) 142/70 (BP Location: Left Arm, Patient Position: Sitting, Cuff Size: Small)   Pulse 80   Temp 98.4 F (36.9 C) (Oral)   Ht 5\' 11"  (1.803 m)   Wt 202 lb 3.2 oz (91.7 kg)   SpO2 100%   BMI 28.20 kg/m   Review of Systems  Constitutional: Negative.   HENT: Negative.   Eyes: Negative.   Respiratory: Negative.   Cardiovascular: Negative.   Gastrointestinal: Negative.   Endocrine: Negative.   Genitourinary: Negative.   Musculoskeletal: Positive for arthralgias (Left ankle gout/arthritis. Generalize arthritic pain. ).  Skin: Negative.   Hematological: Negative.   Psychiatric/Behavioral: Negative.    Objective:   Physical Exam  Constitutional: He is oriented to person, place, and time. He appears well-developed and well-nourished.  HENT:  Head: Normocephalic and atraumatic.  Right Ear: External ear normal.  Left Ear: External ear normal.  Nose: Nose normal.  Mouth/Throat: Oropharynx is clear and moist.  Eyes: Pupils are equal, round, and reactive to light. Conjunctivae and EOM are normal.  Neck: Normal range of motion. Neck supple.  Cardiovascular: Normal rate, regular rhythm, normal heart sounds and intact distal pulses.  Pulmonary/Chest: Effort normal and breath sounds normal.  Abdominal: Soft. Bowel sounds are normal.  Musculoskeletal: Normal range of motion.  Neurological: He is alert and oriented to person, place, and time.  Skin: Skin is warm and dry. Capillary refill takes less than 2 seconds.  Psychiatric: He has a normal mood and affect. His behavior  is normal. Judgment and thought content normal.  Nursing note and vitals reviewed.  Assessment & Plan   1. Diabetes mellitus type 2 in nonobese (HCC) Stable. Hgb A1c is mildly increased at 7.1 today, from 6.9 on 07/12/2017. He will continue to decrease foods/beverages high in sugars and carbs and follow Heart Healthy or DASH diet. Increase physical activity to at least 30 minutes cardio exercise daily.   - POCT glycosylated hemoglobin (Hb A1C)  2. Essential hypertension Antihypertension medications are effective. Blood pressure today is stable at 142/70. He will continue Atenolol  and Vasotec as prescribed. She will continue to decrease high sodium intake, excessive alcohol intake, increase potassium intake, smoking cessation, and increase physical activity of at least 30 minutes of cardio activity daily. She will continue to follow Heart Healthy or DASH diet.  3. Acute idiopathic gout of left ankle Stable today. He will continue taking Colchicine as needed. He will decrease eating foods high in purines. He also drinks Cherry juice to aide in gouty episodes.   4. Tobacco abuse He is currently smoking 4-5 cigarettes daily.   5. Seasonal allergies Stable. Continue Zyrtec as prescribed.  - cetirizine (ZYRTEC) 10 MG tablet; Take 1 tablet (10 mg total) by mouth daily.  Dispense: 30 tablet; Refill: 11  6. Encounter for smoking cessation counseling He will contact when he is ready to quit smoking.   7. HIV Stable. He will continue to follow up with Infection Disease physician as needed. Continue antiviral medications.   8. Follow up He will follow up in 6 months.  - POCT glycosylated hemoglobin (Hb A1C) - POCT urinalysis dipstick  Meds ordered this encounter  Medications  . cetirizine (ZYRTEC) 10 MG tablet    Sig: Take 1 tablet (10 mg total) by mouth daily.    Dispense:  30 tablet    Refill:  11    Raliegh Ip,  MSN, [facility] Patient [facility] [address] [place], [place] [address] [phone]

## 2018-02-07 NOTE — Patient Instructions (Signed)
Gout Gout is painful swelling that can happen in some of your joints. Gout is a type of arthritis. This condition is caused by having too much uric acid in your body. Uric acid is a chemical that is made when your body breaks down substances called purines. If your body has too much uric acid, sharp crystals can form and build up in your joints. This causes pain and swelling. Gout attacks can happen quickly and be very painful (acute gout). Over time, the attacks can affect more joints and happen more often (chronic gout). Follow these instructions at home: During a Gout Attack  If directed, put ice on the painful area: ? Put ice in a plastic bag. ? Place a towel between your skin and the bag. ? Leave the ice on for 20 minutes, 2-3 times a day.  Rest the joint as much as possible. If the joint is in your leg, you may be given crutches to use.  Raise (elevate) the painful joint above the level of your heart as often as you can.  Drink enough fluids to keep your pee (urine) clear or pale yellow.  Take over-the-counter and prescription medicines only as told by your doctor.  Do not drive or use heavy machinery while taking prescription pain medicine.  Follow instructions from your doctor about what you can or cannot eat and drink.  Return to your normal activities as told by your doctor. Ask your doctor what activities are safe for you. Avoiding Future Gout Attacks  Follow a low-purine diet as told by a specialist (dietitian) or your doctor. Avoid foods and drinks that have a lot of purines, such as: ? Liver. ? Kidney. ? Anchovies. ? Asparagus. ? Herring. ? Mushrooms ? Mussels. ? Beer.  Limit alcohol intake to no more than 1 drink a day for nonpregnant women and 2 drinks a day for men. One drink equals 12 oz of beer, 5 oz of wine, or 1 oz of hard liquor.  Stay at a healthy weight or lose weight if you are overweight. If you want to lose weight, talk with your doctor. It is  important that you do not lose weight too fast.  Start or continue an exercise plan as told by your doctor.  Drink enough fluids to keep your pee clear or pale yellow.  Take over-the-counter and prescription medicines only as told by your doctor.  Keep all follow-up visits as told by your doctor. This is important. Contact a doctor if:  You have another gout attack.  You still have symptoms of a gout attack after10 days of treatment.  You have problems (side effects) because of your medicines.  You have chills or a fever.  You have burning pain when you pee (urinate).  You have pain in your lower back or belly. Get help right away if:  You have very bad pain.  Your pain cannot be controlled.  You cannot pee. This information is not intended to replace advice given to you by your health care provider. Make sure you discuss any questions you have with your health care provider. Document Released: 03/01/2008 Document Revised: 10/29/2015 Document Reviewed: 03/05/2015 Elsevier Interactive Patient Education  2018 [employer]. Uric Acid Nephropathy Uric acid is a chemical compound that is made when your body digests some kinds of food and also when your body breaks down dead cells. It is a waste product that is normally removed from your body by your kidneys. If you have too much uric  acid in your blood, it can build up in your kidneys and cause damage (nephropathy). Uric acid nephropathy can be sudden (acute) or long-term (chronic). The acute type results from a sudden buildup of uric acid. This can happen if you have cancer or receive drug treatment for cancer (chemotherapy) that makes you lose cells rapidly. The cell breakdown produces excess uric acid. As uric acid builds up in your kidneys, it causes an increase of pressure and a loss of blood supply. This makes your kidneys less able to filter blood and make urine. Symptoms of acute uric acid nephropathy may start within days of  starting chemotherapy. Chronic uric acid nephropathy may happen if you have gout. This disease forms excess uric acid into crystals. These crystals can get stuck inside of joints and cause painful swelling. They may also build up in your kidneys and cause long-term damage. What are the causes? Uric acid nephropathy is caused by having too much uric acid from your diet or from cell breakdown. What increases the risk? You may be at greater risk for uric acid nephropathy if you:  Are male.  Are 8 years old or older.  Take medicine that can cause excess uric acid. Examples are aspirin, water pills, and medicines that are prescribed after an organ transplant.  Eat a lot of foods that are high in certain natural chemical compounds (purines). Shellfish and red meat contain a lot of purines.  Have gout.  Drink too much alcohol.  Are having chemotherapy.  Have frequent seizures.  Have severe diarrhea, which causes fluid loss (dehydration).  What are the signs or symptoms? Signs and symptoms depend on the type of nephropathy that you have. They may include:  Decreased urine output.  Nausea and vomiting.  Lack of energy.  Seizures.  High blood pressure.  Kidney infection.  Blood-tinged urine.  Pain when passing urine.  Pain in the sides of the lower back (flank pain).  How is this diagnosed? Your health care provider may suspect uric acid nephropathy from your signs and symptoms, especially if you have gout. A physical exam will be done. Tests may be done to confirm the diagnosis, such as:  Blood and urine tests. This is the best way to measure high levels of uric acid.  Imaging studies to check for kidney stones or kidney damage. These may include: ? X-rays. ? A test that uses sound waves to create images (ultrasound). ? CT scan. ? MRI.  How is this treated? The goal of treatment is to lower the level of uric acid in your body and prevent kidney damage. This can be  done by:  Taking medicines that block uric acid production. ? Several medicines can block the production of uric acid as a waste product. The most commonly used medicine is allopurinol. ? If you are starting chemotherapy treatment, ask your health care provider if you should start taking a medicine to prevent high uric acid.  Starting a diet plan to lower your intake of purines. Working with a Dealer, such as a Museum/gallery exhibitions officer, can help you to follow a healthy diet and limit your intake of foods and drinks that increase uric acid.  Preventing uric acid buildup. This can be done by drinking lots of water to maintain a good flow of urine and lower the acidity of your urine. You may also need to take medicine called bicarbonate.  Resting the kidneys by using a machine to filter your blood (hemodialysis), if necessary. ? You  may need hemodialysis if your kidneys are not working well because of uric acid damage. ? In hemodialysis, your blood is removed, passed through a filtering machine, and then returned to your body. ? Several sessions of hemodialysis usually improve kidney function by removing uric acid.  Follow these instructions at home:  Take medicines only as directed by your health care provider.  Drink enough fluid to keep your urine clear or pale yellow.  Do not drink alcohol.  Do not drink beverages that contain the sugar fructose.  Limit how much red meat and shellfish you eat.  Include plenty of low-fat dairy foods in your diet.  Maintain a healthy weight. Lose weight as directed by your health care provider. Contact a health care provider if:  You feel tired and have low energy, even when you get enough sleep.  You have pain when passing urine.  You have nausea or vomiting. Get help right away if:  You produce very little urine, even when you drink enough fluids.  You have blood in your urine.  You have a seizure. This information is not  intended to replace advice given to you by your health care provider. Make sure you discuss any questions you have with your health care provider. Document Released: 03/20/2007 Document Revised: 12/11/2015 Document Reviewed: 10/30/2013 Elsevier Interactive Patient Education  2018 [employer].

## 2018-02-13 MED FILL — COLCRYS 0.6 MG TABLET: 0.6 | 30 days supply | Qty: 30 | Fill #3

## 2018-02-13 MED FILL — ODEFSEY 200-25-25 MG TABS: 200-25-25 | 30 days supply | Qty: 30 | Fill #4

## 2018-02-13 MED FILL — TIVICAY 50 MG TABLET: 50 | 30 days supply | Qty: 30 | Fill #4

## 2018-03-08 MED FILL — CETIRIZINE HCL 10 MG TABLET: 10 | 30 days supply | Qty: 30 | Fill #0

## 2018-03-08 MED FILL — ENALAPRIL MALEATE 10 MG TAB: 10 | 90 days supply | Qty: 90 | Fill #0

## 2018-03-09 MED FILL — ODEFSEY 200-25-25 MG TABS: 200-25-25 | 30 days supply | Qty: 30 | Fill #4

## 2018-03-09 MED FILL — TIVICAY 50 MG TABLET: 50 | 30 days supply | Qty: 30 | Fill #4

## 2018-03-21 ENCOUNTER — Ambulatory Visit (INDEPENDENT_AMBULATORY_CARE_PROVIDER_SITE_OTHER): Payer: Medicare Other | Admitting: Internal Medicine

## 2018-03-21 ENCOUNTER — Encounter: Payer: Self-pay | Admitting: Internal Medicine

## 2018-03-21 VITALS — BP 188/100 | HR 73 | Temp 98.1°F | Wt 202.0 lb

## 2018-03-21 DIAGNOSIS — Z720 Tobacco use: Secondary | ICD-10-CM

## 2018-03-21 DIAGNOSIS — Z79899 Other long term (current) drug therapy: Secondary | ICD-10-CM

## 2018-03-21 DIAGNOSIS — B20 Human immunodeficiency virus [HIV] disease: Secondary | ICD-10-CM

## 2018-03-21 DIAGNOSIS — Z113 Encounter for screening for infections with a predominantly sexual mode of transmission: Secondary | ICD-10-CM

## 2018-03-21 DIAGNOSIS — Z23 Encounter for immunization: Secondary | ICD-10-CM

## 2018-03-21 NOTE — Assessment & Plan Note (Signed)
Will screen today 

## 2018-03-21 NOTE — Progress Notes (Signed)
   Subjective:    Patient ID: Chad Moses, male    DOB: 06/20/1960, 56 y.o.   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  He denies any missed doses.  Having issues with his knee and being followed by his PCP.  No missed doses now.  No associated n/v or rash.     Review of Systems  Constitutional: Negative for fatigue.  Gastrointestinal: Negative for diarrhea.  Skin: Negative for rash.       Objective:   Physical Exam  Constitutional: He appears well-developed and well-nourished. No distress.  Eyes: No scleral icterus.  Cardiovascular: Normal rate, regular rhythm and normal heart sounds.  No murmur heard. Pulmonary/Chest: Effort normal and breath sounds normal. No respiratory distress.  Skin: No rash noted.   SH: + tobacco       Assessment & Plan:   

## 2018-03-21 NOTE — Assessment & Plan Note (Signed)
Discussed Prevnar and given.  Refused flu shot

## 2018-03-21 NOTE — Addendum Note (Signed)
Addended by: Gerarda Fraction on: 03/21/2018 11:05 AM   Modules accepted: Orders

## 2018-03-21 NOTE — Assessment & Plan Note (Signed)
Doing well on his salvage regimen.  No changes and rtc 6 months

## 2018-03-22 LAB — T-HELPER CELL (CD4) - (RCID CLINIC ONLY)
CD4 % Helper T Cell: 21 % — ABNORMAL LOW (ref 33–55)
CD4 T Cell Abs: 510 /uL (ref 400–2700)

## 2018-03-24 LAB — COMPLETE METABOLIC PANEL WITH GFR
AG Ratio: 1.3 (calc) (ref 1.0–2.5)
ALKALINE PHOSPHATASE (APISO): 95 U/L (ref 40–115)
ALT: 30 U/L (ref 9–46)
AST: 18 U/L (ref 10–35)
Albumin: 4.2 g/dL (ref 3.6–5.1)
BILIRUBIN TOTAL: 0.5 mg/dL (ref 0.2–1.2)
BUN/Creatinine Ratio: 6 (calc) (ref 6–22)
BUN: 11 mg/dL (ref 7–25)
CHLORIDE: 105 mmol/L (ref 98–110)
CO2: 27 mmol/L (ref 20–32)
Calcium: 9.7 mg/dL (ref 8.6–10.3)
Creat: 1.7 mg/dL — ABNORMAL HIGH (ref 0.70–1.33)
GFR, Est African American: 51 mL/min/{1.73_m2} — ABNORMAL LOW (ref >=60)
GFR, Est Non African American: 44 mL/min/{1.73_m2} — ABNORMAL LOW (ref >=60)
GLUCOSE: 137 mg/dL — AB (ref 65–99)
Globulin: 3.3 g/dL (calc) (ref 1.9–3.7)
POTASSIUM: 4.2 mmol/L (ref 3.5–5.3)
Sodium: 140 mmol/L (ref 135–146)
TOTAL PROTEIN: 7.5 g/dL (ref 6.1–8.1)

## 2018-03-24 LAB — HIV-1 RNA QUANT-NO REFLEX-BLD
HIV 1 RNA QUANT: 39 {copies}/mL — AB
HIV-1 RNA Quant, Log: 1.59 Log copies/mL — ABNORMAL HIGH

## 2018-03-24 LAB — RPR: RPR Ser Ql: NONREACTIVE

## 2018-04-04 MED FILL — ODEFSEY 200-25-25 MG TABS: 200-25-25 | 30 days supply | Qty: 30 | Fill #5

## 2018-04-04 MED FILL — TIVICAY 50 MG TABLET: 50 | 30 days supply | Qty: 30 | Fill #5

## 2018-04-04 MED FILL — COLCRYS 0.6 MG TABLET: 0.6 | 30 days supply | Qty: 30 | Fill #2

## 2018-05-02 MED FILL — ODEFSEY 200-25-25 MG TABS: 200-25-25 | 30 days supply | Qty: 30 | Fill #6

## 2018-05-02 MED FILL — TIVICAY 50 MG TABLET: 50 | 30 days supply | Qty: 30 | Fill #6

## 2018-06-07 ENCOUNTER — Telehealth: Payer: Self-pay | Admitting: *Deleted

## 2018-06-07 NOTE — Telephone Encounter (Signed)
Received call from Windell Moulding at housing authority [phone], asking if Dr Luciana Axe would sign off on patient's request to have have a 2 bedroom apartment to accommodate a live-in aid due to medical necessity.  This was previously faxed 04/30/18, but had not been filled out/returned to the housing authority (no documentation in patient's chart regarding this request). RN asked if Windell Moulding had sent this form to patient's primary care provider as well, gave her the contact number to [employer]. Andree Coss, RN

## 2018-06-07 NOTE — Telephone Encounter (Signed)
Spoke to [employer] from [place] housing authority in [place].  Informed her Dr. Luciana Axe would not be filling out housing paperwork for the patient who is in [place]. Ruthie stated she sent paperwork to patient's primary care provider.  Paperwork will be filed in Triage if there are any changes. Angeline Slim RN

## 2018-06-19 DIAGNOSIS — R413 Other amnesia: Secondary | ICD-10-CM | POA: Diagnosis not present

## 2018-06-19 DIAGNOSIS — R410 Disorientation, unspecified: Secondary | ICD-10-CM | POA: Diagnosis not present

## 2018-06-19 DIAGNOSIS — E119 Type 2 diabetes mellitus without complications: Secondary | ICD-10-CM | POA: Diagnosis not present

## 2018-06-19 DIAGNOSIS — Z131 Encounter for screening for diabetes mellitus: Secondary | ICD-10-CM | POA: Diagnosis not present

## 2018-06-19 DIAGNOSIS — F329 Major depressive disorder, single episode, unspecified: Secondary | ICD-10-CM | POA: Diagnosis not present

## 2018-06-19 DIAGNOSIS — B20 Human immunodeficiency virus [HIV] disease: Secondary | ICD-10-CM | POA: Diagnosis not present

## 2018-06-19 DIAGNOSIS — Z0100 Encounter for examination of eyes and vision without abnormal findings: Secondary | ICD-10-CM | POA: Diagnosis not present

## 2018-06-20 DIAGNOSIS — F329 Major depressive disorder, single episode, unspecified: Secondary | ICD-10-CM | POA: Diagnosis not present

## 2018-06-20 DIAGNOSIS — B20 Human immunodeficiency virus [HIV] disease: Secondary | ICD-10-CM | POA: Diagnosis not present

## 2018-06-20 DIAGNOSIS — R410 Disorientation, unspecified: Secondary | ICD-10-CM | POA: Diagnosis not present

## 2018-06-20 DIAGNOSIS — R413 Other amnesia: Secondary | ICD-10-CM | POA: Diagnosis not present

## 2018-06-20 DIAGNOSIS — Z0100 Encounter for examination of eyes and vision without abnormal findings: Secondary | ICD-10-CM | POA: Diagnosis not present

## 2018-06-20 DIAGNOSIS — E119 Type 2 diabetes mellitus without complications: Secondary | ICD-10-CM | POA: Diagnosis not present

## 2018-06-20 DIAGNOSIS — Z131 Encounter for screening for diabetes mellitus: Secondary | ICD-10-CM | POA: Diagnosis not present

## 2018-06-22 DIAGNOSIS — Z9181 History of falling: Secondary | ICD-10-CM | POA: Diagnosis not present

## 2018-06-22 DIAGNOSIS — Z7984 Long term (current) use of oral hypoglycemic drugs: Secondary | ICD-10-CM | POA: Diagnosis not present

## 2018-06-22 DIAGNOSIS — B20 Human immunodeficiency virus [HIV] disease: Secondary | ICD-10-CM | POA: Diagnosis not present

## 2018-06-22 DIAGNOSIS — Z7982 Long term (current) use of aspirin: Secondary | ICD-10-CM | POA: Diagnosis not present

## 2018-06-22 DIAGNOSIS — F17210 Nicotine dependence, cigarettes, uncomplicated: Secondary | ICD-10-CM | POA: Diagnosis not present

## 2018-06-22 DIAGNOSIS — I10 Essential (primary) hypertension: Secondary | ICD-10-CM | POA: Diagnosis not present

## 2018-06-22 DIAGNOSIS — M150 Primary generalized (osteo)arthritis: Secondary | ICD-10-CM | POA: Diagnosis not present

## 2018-06-22 DIAGNOSIS — E119 Type 2 diabetes mellitus without complications: Secondary | ICD-10-CM | POA: Diagnosis not present

## 2018-06-22 DIAGNOSIS — F329 Major depressive disorder, single episode, unspecified: Secondary | ICD-10-CM | POA: Diagnosis not present

## 2018-06-28 DIAGNOSIS — E119 Type 2 diabetes mellitus without complications: Secondary | ICD-10-CM | POA: Diagnosis not present

## 2018-06-28 DIAGNOSIS — B20 Human immunodeficiency virus [HIV] disease: Secondary | ICD-10-CM | POA: Diagnosis not present

## 2018-06-28 DIAGNOSIS — F329 Major depressive disorder, single episode, unspecified: Secondary | ICD-10-CM | POA: Diagnosis not present

## 2018-06-28 DIAGNOSIS — Z9181 History of falling: Secondary | ICD-10-CM | POA: Diagnosis not present

## 2018-06-28 DIAGNOSIS — I10 Essential (primary) hypertension: Secondary | ICD-10-CM | POA: Diagnosis not present

## 2018-06-28 DIAGNOSIS — M150 Primary generalized (osteo)arthritis: Secondary | ICD-10-CM | POA: Diagnosis not present

## 2018-07-05 DIAGNOSIS — Z9181 History of falling: Secondary | ICD-10-CM | POA: Diagnosis not present

## 2018-07-05 DIAGNOSIS — M150 Primary generalized (osteo)arthritis: Secondary | ICD-10-CM | POA: Diagnosis not present

## 2018-07-05 DIAGNOSIS — I10 Essential (primary) hypertension: Secondary | ICD-10-CM | POA: Diagnosis not present

## 2018-07-05 DIAGNOSIS — F329 Major depressive disorder, single episode, unspecified: Secondary | ICD-10-CM | POA: Diagnosis not present

## 2018-07-05 DIAGNOSIS — E119 Type 2 diabetes mellitus without complications: Secondary | ICD-10-CM | POA: Diagnosis not present

## 2018-07-05 DIAGNOSIS — B20 Human immunodeficiency virus [HIV] disease: Secondary | ICD-10-CM | POA: Diagnosis not present

## 2018-07-09 IMAGING — DX DG ABDOMEN 2V
2 series · 2 of 2 positions shown · non-contrast
Comparison: Pelvis 01/03/2012

CLINICAL DATA: Abdominal pain 2 days over the mid to lower abdomen
extending to the left lower quadrant.

EXAM:
ABDOMEN - 2 VIEW

[abdomen erect]
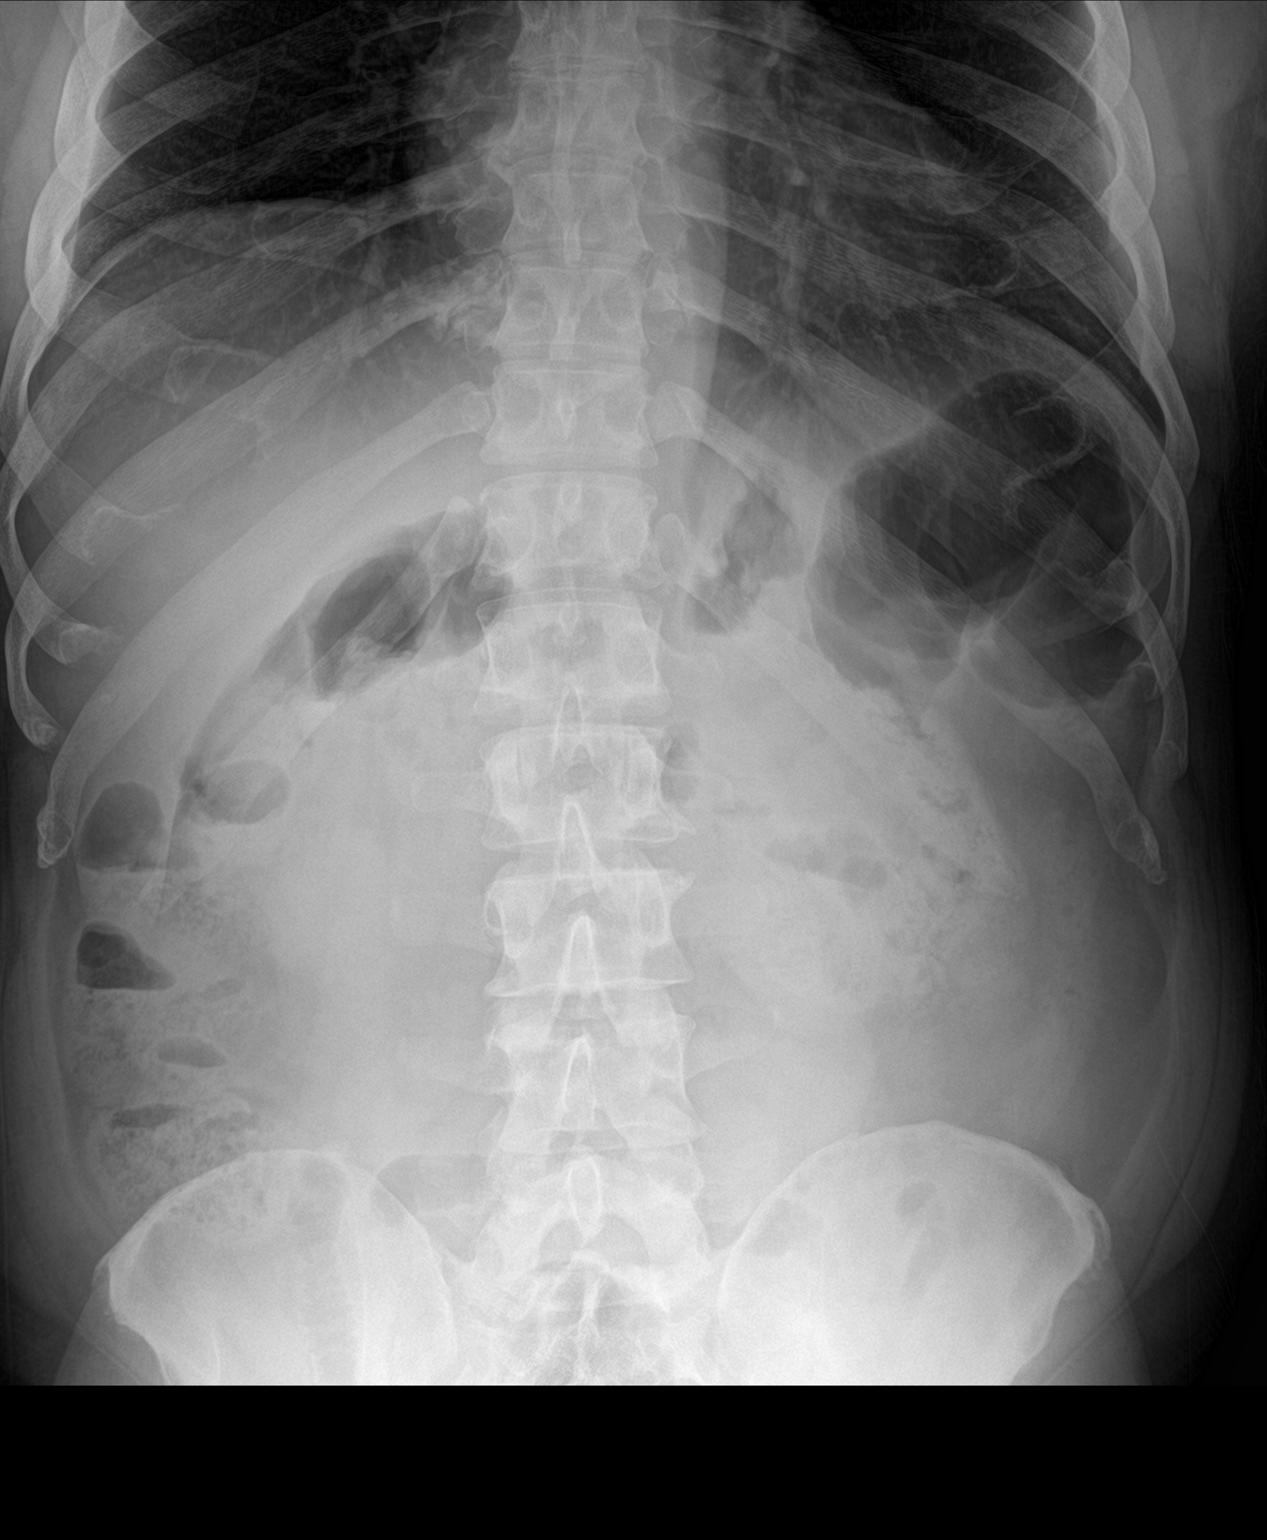

[abdomen supine]
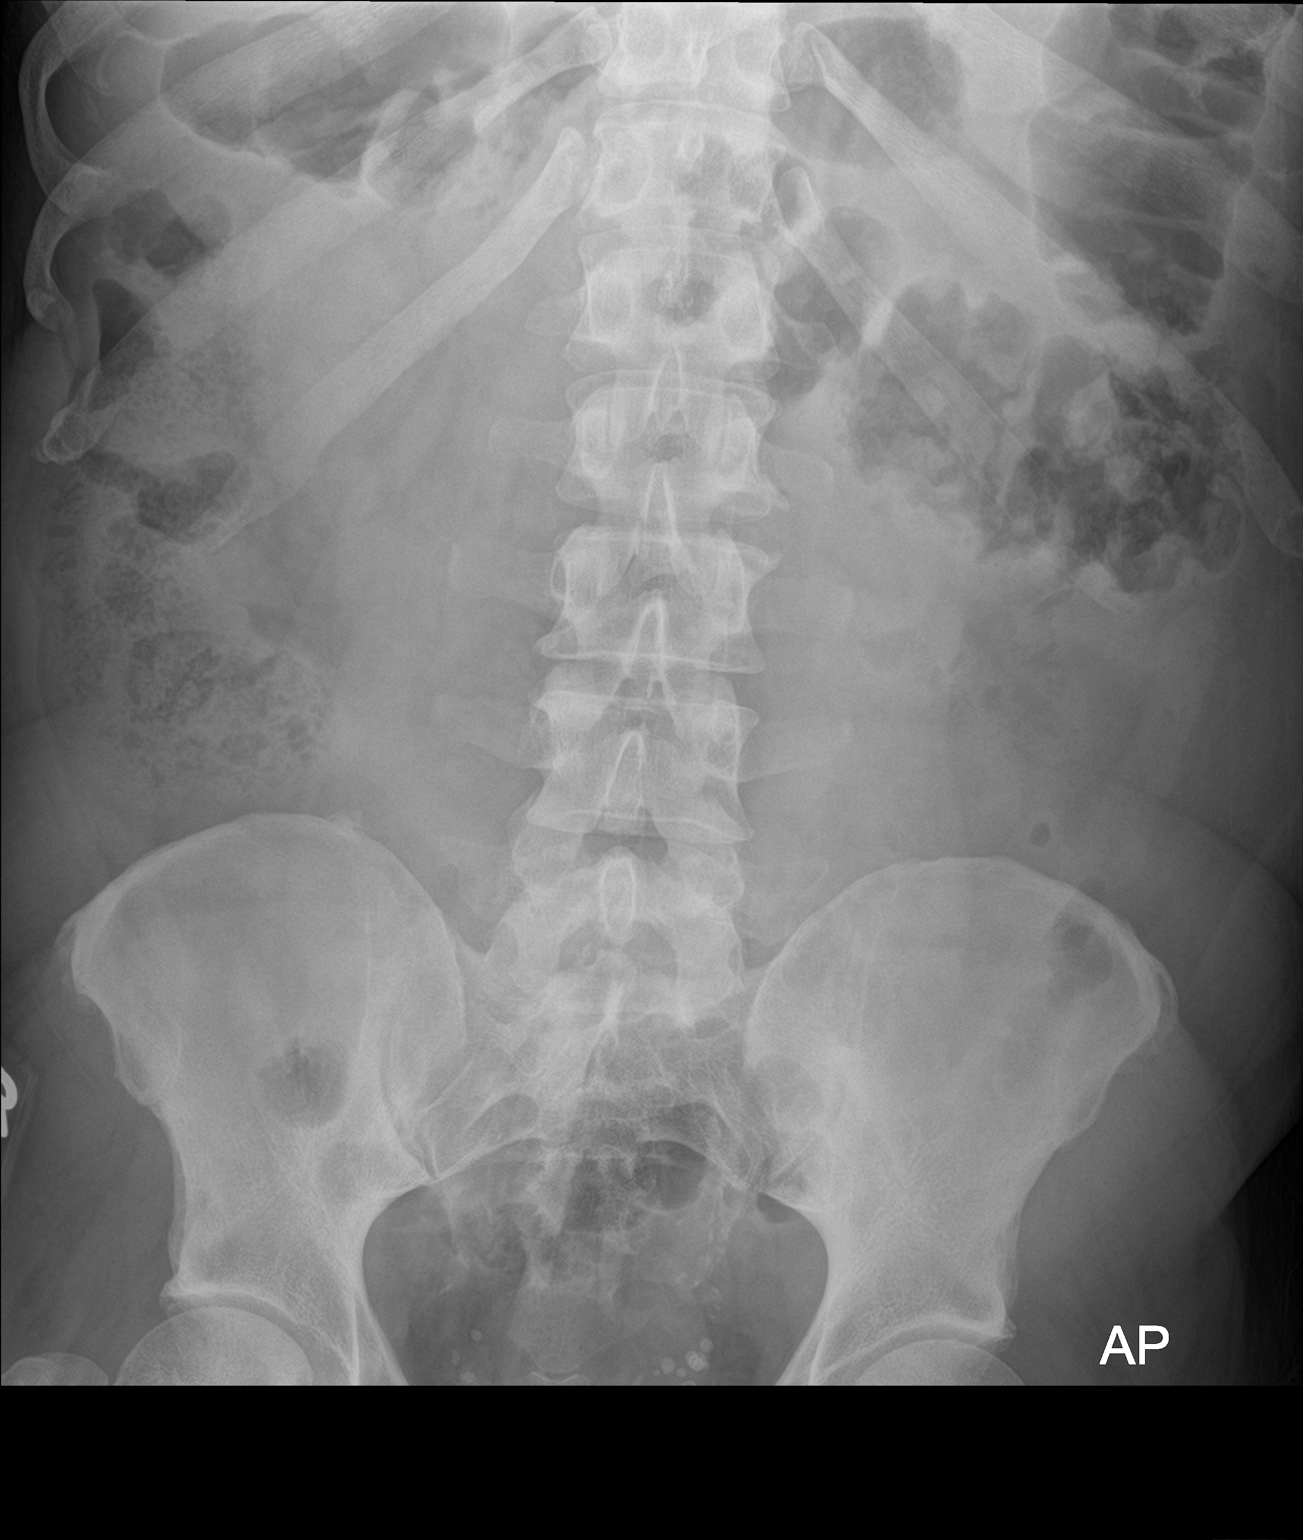

[2 of 2 positions shown; findings below may reference images not displayed]

FINDINGS: Bowel gas pattern is nonobstructive with mild fecal retention over
the right colon. No free peritoneal air. Few air-fluid levels over
the right colon. No mass or mass effect. Multiple phleboliths over
the pelvis. Minimal degenerative change of the spine and hips.
IMPRESSION: Nonobstructive bowel gas pattern.

## 2018-07-12 DIAGNOSIS — Z9181 History of falling: Secondary | ICD-10-CM | POA: Diagnosis not present

## 2018-07-12 DIAGNOSIS — B20 Human immunodeficiency virus [HIV] disease: Secondary | ICD-10-CM | POA: Diagnosis not present

## 2018-07-12 DIAGNOSIS — F329 Major depressive disorder, single episode, unspecified: Secondary | ICD-10-CM | POA: Diagnosis not present

## 2018-07-12 DIAGNOSIS — M150 Primary generalized (osteo)arthritis: Secondary | ICD-10-CM | POA: Diagnosis not present

## 2018-07-12 DIAGNOSIS — I10 Essential (primary) hypertension: Secondary | ICD-10-CM | POA: Diagnosis not present

## 2018-07-12 DIAGNOSIS — E119 Type 2 diabetes mellitus without complications: Secondary | ICD-10-CM | POA: Diagnosis not present

## 2018-07-19 DIAGNOSIS — I10 Essential (primary) hypertension: Secondary | ICD-10-CM | POA: Diagnosis not present

## 2018-07-19 DIAGNOSIS — F329 Major depressive disorder, single episode, unspecified: Secondary | ICD-10-CM | POA: Diagnosis not present

## 2018-07-19 DIAGNOSIS — E119 Type 2 diabetes mellitus without complications: Secondary | ICD-10-CM | POA: Diagnosis not present

## 2018-07-19 DIAGNOSIS — M150 Primary generalized (osteo)arthritis: Secondary | ICD-10-CM | POA: Diagnosis not present

## 2018-07-19 DIAGNOSIS — Z9181 History of falling: Secondary | ICD-10-CM | POA: Diagnosis not present

## 2018-07-19 DIAGNOSIS — B20 Human immunodeficiency virus [HIV] disease: Secondary | ICD-10-CM | POA: Diagnosis not present

## 2018-07-20 DIAGNOSIS — Z029 Encounter for administrative examinations, unspecified: Secondary | ICD-10-CM | POA: Diagnosis not present

## 2018-07-20 DIAGNOSIS — F329 Major depressive disorder, single episode, unspecified: Secondary | ICD-10-CM | POA: Diagnosis not present

## 2018-07-22 DIAGNOSIS — I10 Essential (primary) hypertension: Secondary | ICD-10-CM | POA: Diagnosis not present

## 2018-07-22 DIAGNOSIS — Z7984 Long term (current) use of oral hypoglycemic drugs: Secondary | ICD-10-CM | POA: Diagnosis not present

## 2018-07-22 DIAGNOSIS — E119 Type 2 diabetes mellitus without complications: Secondary | ICD-10-CM | POA: Diagnosis not present

## 2018-07-22 DIAGNOSIS — F329 Major depressive disorder, single episode, unspecified: Secondary | ICD-10-CM | POA: Diagnosis not present

## 2018-07-22 DIAGNOSIS — Z9181 History of falling: Secondary | ICD-10-CM | POA: Diagnosis not present

## 2018-07-22 DIAGNOSIS — B20 Human immunodeficiency virus [HIV] disease: Secondary | ICD-10-CM | POA: Diagnosis not present

## 2018-07-22 DIAGNOSIS — M150 Primary generalized (osteo)arthritis: Secondary | ICD-10-CM | POA: Diagnosis not present

## 2018-07-22 DIAGNOSIS — F17210 Nicotine dependence, cigarettes, uncomplicated: Secondary | ICD-10-CM | POA: Diagnosis not present

## 2018-07-22 DIAGNOSIS — Z7982 Long term (current) use of aspirin: Secondary | ICD-10-CM | POA: Diagnosis not present

## 2018-07-23 DIAGNOSIS — E119 Type 2 diabetes mellitus without complications: Secondary | ICD-10-CM | POA: Diagnosis not present

## 2018-07-23 DIAGNOSIS — R413 Other amnesia: Secondary | ICD-10-CM | POA: Diagnosis not present

## 2018-08-08 ENCOUNTER — Ambulatory Visit: Payer: Medicare Other | Admitting: Family Medicine

## 2018-08-16 DIAGNOSIS — M150 Primary generalized (osteo)arthritis: Secondary | ICD-10-CM | POA: Diagnosis not present

## 2018-08-16 DIAGNOSIS — B20 Human immunodeficiency virus [HIV] disease: Secondary | ICD-10-CM | POA: Diagnosis not present

## 2018-08-16 DIAGNOSIS — I10 Essential (primary) hypertension: Secondary | ICD-10-CM | POA: Diagnosis not present

## 2018-08-16 DIAGNOSIS — Z9181 History of falling: Secondary | ICD-10-CM | POA: Diagnosis not present

## 2018-08-16 DIAGNOSIS — F329 Major depressive disorder, single episode, unspecified: Secondary | ICD-10-CM | POA: Diagnosis not present

## 2018-08-16 DIAGNOSIS — E119 Type 2 diabetes mellitus without complications: Secondary | ICD-10-CM | POA: Diagnosis not present

## 2018-08-21 DIAGNOSIS — F17210 Nicotine dependence, cigarettes, uncomplicated: Secondary | ICD-10-CM | POA: Diagnosis not present

## 2018-08-21 DIAGNOSIS — I10 Essential (primary) hypertension: Secondary | ICD-10-CM | POA: Diagnosis not present

## 2018-08-21 DIAGNOSIS — E119 Type 2 diabetes mellitus without complications: Secondary | ICD-10-CM | POA: Diagnosis not present

## 2018-08-21 DIAGNOSIS — Z7984 Long term (current) use of oral hypoglycemic drugs: Secondary | ICD-10-CM | POA: Diagnosis not present

## 2018-08-21 DIAGNOSIS — F322 Major depressive disorder, single episode, severe without psychotic features: Secondary | ICD-10-CM | POA: Diagnosis not present

## 2018-08-21 DIAGNOSIS — R413 Other amnesia: Secondary | ICD-10-CM | POA: Diagnosis not present

## 2018-08-21 DIAGNOSIS — Z9181 History of falling: Secondary | ICD-10-CM | POA: Diagnosis not present

## 2018-08-21 DIAGNOSIS — B20 Human immunodeficiency virus [HIV] disease: Secondary | ICD-10-CM | POA: Diagnosis not present

## 2018-08-21 DIAGNOSIS — Z7982 Long term (current) use of aspirin: Secondary | ICD-10-CM | POA: Diagnosis not present

## 2018-08-21 DIAGNOSIS — M150 Primary generalized (osteo)arthritis: Secondary | ICD-10-CM | POA: Diagnosis not present

## 2018-08-29 DIAGNOSIS — M150 Primary generalized (osteo)arthritis: Secondary | ICD-10-CM | POA: Diagnosis not present

## 2018-08-29 DIAGNOSIS — B20 Human immunodeficiency virus [HIV] disease: Secondary | ICD-10-CM | POA: Diagnosis not present

## 2018-08-29 DIAGNOSIS — R413 Other amnesia: Secondary | ICD-10-CM | POA: Diagnosis not present

## 2018-08-29 DIAGNOSIS — E119 Type 2 diabetes mellitus without complications: Secondary | ICD-10-CM | POA: Diagnosis not present

## 2018-08-29 DIAGNOSIS — I10 Essential (primary) hypertension: Secondary | ICD-10-CM | POA: Diagnosis not present

## 2018-08-29 DIAGNOSIS — F322 Major depressive disorder, single episode, severe without psychotic features: Secondary | ICD-10-CM | POA: Diagnosis not present

## 2018-09-03 DIAGNOSIS — E119 Type 2 diabetes mellitus without complications: Secondary | ICD-10-CM | POA: Diagnosis not present

## 2018-09-03 DIAGNOSIS — Z7182 Exercise counseling: Secondary | ICD-10-CM | POA: Diagnosis not present

## 2018-09-03 DIAGNOSIS — Z713 Dietary counseling and surveillance: Secondary | ICD-10-CM | POA: Diagnosis not present

## 2018-09-03 DIAGNOSIS — F329 Major depressive disorder, single episode, unspecified: Secondary | ICD-10-CM | POA: Diagnosis not present

## 2018-09-03 DIAGNOSIS — B20 Human immunodeficiency virus [HIV] disease: Secondary | ICD-10-CM | POA: Diagnosis not present

## 2018-09-03 DIAGNOSIS — I10 Essential (primary) hypertension: Secondary | ICD-10-CM | POA: Diagnosis not present

## 2018-09-03 DIAGNOSIS — Z6827 Body mass index (BMI) 27.0-27.9, adult: Secondary | ICD-10-CM | POA: Diagnosis not present

## 2018-09-13 DIAGNOSIS — Z283 Underimmunization status: Secondary | ICD-10-CM | POA: Diagnosis not present

## 2018-09-13 DIAGNOSIS — B20 Human immunodeficiency virus [HIV] disease: Secondary | ICD-10-CM | POA: Diagnosis not present

## 2018-09-13 DIAGNOSIS — Z1159 Encounter for screening for other viral diseases: Secondary | ICD-10-CM | POA: Diagnosis not present

## 2018-09-13 DIAGNOSIS — Z113 Encounter for screening for infections with a predominantly sexual mode of transmission: Secondary | ICD-10-CM | POA: Diagnosis not present

## 2018-09-13 DIAGNOSIS — Z13228 Encounter for screening for other metabolic disorders: Secondary | ICD-10-CM | POA: Diagnosis not present

## 2018-09-13 DIAGNOSIS — Z20828 Contact with and (suspected) exposure to other viral communicable diseases: Secondary | ICD-10-CM | POA: Diagnosis not present

## 2018-09-24 ENCOUNTER — Ambulatory Visit: Payer: Medicare Other | Admitting: Internal Medicine

## 2018-09-26 ENCOUNTER — Ambulatory Visit: Payer: Medicare Other | Admitting: Internal Medicine

## 2018-10-15 DIAGNOSIS — E119 Type 2 diabetes mellitus without complications: Secondary | ICD-10-CM | POA: Diagnosis not present

## 2018-10-15 DIAGNOSIS — B20 Human immunodeficiency virus [HIV] disease: Secondary | ICD-10-CM | POA: Diagnosis not present

## 2018-10-15 DIAGNOSIS — E785 Hyperlipidemia, unspecified: Secondary | ICD-10-CM | POA: Diagnosis not present

## 2018-10-15 DIAGNOSIS — N183 Chronic kidney disease, stage 3 (moderate): Secondary | ICD-10-CM | POA: Diagnosis not present

## 2018-10-15 DIAGNOSIS — F3341 Major depressive disorder, recurrent, in partial remission: Secondary | ICD-10-CM | POA: Diagnosis not present

## 2018-10-15 DIAGNOSIS — F17201 Nicotine dependence, unspecified, in remission: Secondary | ICD-10-CM | POA: Diagnosis not present

## 2018-10-15 DIAGNOSIS — I10 Essential (primary) hypertension: Secondary | ICD-10-CM | POA: Diagnosis not present

## 2018-10-15 DIAGNOSIS — R768 Other specified abnormal immunological findings in serum: Secondary | ICD-10-CM | POA: Diagnosis not present

## 2018-10-15 DIAGNOSIS — Z130 Encounter for screening for diseases of the blood and blood-forming organs and certain disorders involving the immune mechanism: Secondary | ICD-10-CM | POA: Diagnosis not present

## 2018-10-17 DIAGNOSIS — N433 Hydrocele, unspecified: Secondary | ICD-10-CM | POA: Diagnosis not present

## 2018-10-17 DIAGNOSIS — E119 Type 2 diabetes mellitus without complications: Secondary | ICD-10-CM | POA: Diagnosis not present

## 2018-10-17 DIAGNOSIS — N5082 Scrotal pain: Secondary | ICD-10-CM | POA: Diagnosis not present

## 2018-10-17 DIAGNOSIS — N451 Epididymitis: Secondary | ICD-10-CM | POA: Diagnosis not present

## 2018-10-17 DIAGNOSIS — N50811 Right testicular pain: Secondary | ICD-10-CM | POA: Diagnosis not present

## 2018-10-17 DIAGNOSIS — N503 Cyst of epididymis: Secondary | ICD-10-CM | POA: Diagnosis not present

## 2018-12-12 DIAGNOSIS — R413 Other amnesia: Secondary | ICD-10-CM | POA: Diagnosis not present

## 2018-12-17 DIAGNOSIS — R413 Other amnesia: Secondary | ICD-10-CM | POA: Diagnosis not present

## 2018-12-21 DIAGNOSIS — R413 Other amnesia: Secondary | ICD-10-CM | POA: Diagnosis not present

## 2019-01-02 DIAGNOSIS — F3341 Major depressive disorder, recurrent, in partial remission: Secondary | ICD-10-CM | POA: Diagnosis not present

## 2019-01-02 DIAGNOSIS — Z113 Encounter for screening for infections with a predominantly sexual mode of transmission: Secondary | ICD-10-CM | POA: Diagnosis not present

## 2019-01-02 DIAGNOSIS — M1A9XX0 Chronic gout, unspecified, without tophus (tophi): Secondary | ICD-10-CM | POA: Diagnosis not present

## 2019-01-02 DIAGNOSIS — E785 Hyperlipidemia, unspecified: Secondary | ICD-10-CM | POA: Diagnosis not present

## 2019-01-02 DIAGNOSIS — B20 Human immunodeficiency virus [HIV] disease: Secondary | ICD-10-CM | POA: Diagnosis not present

## 2019-01-02 DIAGNOSIS — Z130 Encounter for screening for diseases of the blood and blood-forming organs and certain disorders involving the immune mechanism: Secondary | ICD-10-CM | POA: Diagnosis not present

## 2019-01-02 DIAGNOSIS — I10 Essential (primary) hypertension: Secondary | ICD-10-CM | POA: Diagnosis not present

## 2019-01-02 DIAGNOSIS — E119 Type 2 diabetes mellitus without complications: Secondary | ICD-10-CM | POA: Diagnosis not present

## 2019-01-02 DIAGNOSIS — N183 Chronic kidney disease, stage 3 (moderate): Secondary | ICD-10-CM | POA: Diagnosis not present

## 2019-01-02 DIAGNOSIS — Z13228 Encounter for screening for other metabolic disorders: Secondary | ICD-10-CM | POA: Diagnosis not present

## 2019-01-02 DIAGNOSIS — F17201 Nicotine dependence, unspecified, in remission: Secondary | ICD-10-CM | POA: Diagnosis not present

## 2019-01-25 DIAGNOSIS — Z130 Encounter for screening for diseases of the blood and blood-forming organs and certain disorders involving the immune mechanism: Secondary | ICD-10-CM | POA: Diagnosis not present

## 2019-01-25 DIAGNOSIS — N183 Chronic kidney disease, stage 3 (moderate): Secondary | ICD-10-CM | POA: Diagnosis not present

## 2019-01-25 DIAGNOSIS — E119 Type 2 diabetes mellitus without complications: Secondary | ICD-10-CM | POA: Diagnosis not present

## 2019-01-25 DIAGNOSIS — I10 Essential (primary) hypertension: Secondary | ICD-10-CM | POA: Diagnosis not present

## 2019-01-25 DIAGNOSIS — B20 Human immunodeficiency virus [HIV] disease: Secondary | ICD-10-CM | POA: Diagnosis not present

## 2019-02-05 DIAGNOSIS — E119 Type 2 diabetes mellitus without complications: Secondary | ICD-10-CM | POA: Diagnosis not present

## 2019-02-05 DIAGNOSIS — E785 Hyperlipidemia, unspecified: Secondary | ICD-10-CM | POA: Diagnosis not present

## 2019-02-05 DIAGNOSIS — N183 Chronic kidney disease, stage 3 (moderate): Secondary | ICD-10-CM | POA: Diagnosis not present

## 2019-02-05 DIAGNOSIS — M1A9XX0 Chronic gout, unspecified, without tophus (tophi): Secondary | ICD-10-CM | POA: Diagnosis not present

## 2019-02-05 DIAGNOSIS — B20 Human immunodeficiency virus [HIV] disease: Secondary | ICD-10-CM | POA: Diagnosis not present

## 2019-02-05 DIAGNOSIS — F3341 Major depressive disorder, recurrent, in partial remission: Secondary | ICD-10-CM | POA: Diagnosis not present

## 2019-02-05 DIAGNOSIS — F17201 Nicotine dependence, unspecified, in remission: Secondary | ICD-10-CM | POA: Diagnosis not present

## 2019-02-05 DIAGNOSIS — E559 Vitamin D deficiency, unspecified: Secondary | ICD-10-CM | POA: Diagnosis not present

## 2019-02-05 DIAGNOSIS — I10 Essential (primary) hypertension: Secondary | ICD-10-CM | POA: Diagnosis not present

## 2022-09-05 DEATH — deceased
